# Patient Record
Sex: Male | Born: 1965
Health system: Southern US, Community
[De-identification: ages and names within clinical notes are randomized; demographics above are authoritative.]

## PROBLEM LIST (undated history)

## (undated) DIAGNOSIS — F32A Depression, unspecified: Secondary | ICD-10-CM

## (undated) DIAGNOSIS — M25511 Pain in right shoulder: Secondary | ICD-10-CM

## (undated) DIAGNOSIS — M1990 Unspecified osteoarthritis, unspecified site: Secondary | ICD-10-CM

## (undated) DIAGNOSIS — M542 Cervicalgia: Secondary | ICD-10-CM

## (undated) DIAGNOSIS — IMO0002 Reserved for concepts with insufficient information to code with codable children: Secondary | ICD-10-CM

## (undated) DIAGNOSIS — F524 Premature ejaculation: Secondary | ICD-10-CM

## (undated) DIAGNOSIS — E785 Hyperlipidemia, unspecified: Secondary | ICD-10-CM

## (undated) DIAGNOSIS — F419 Anxiety disorder, unspecified: Secondary | ICD-10-CM

## (undated) DIAGNOSIS — K219 Gastro-esophageal reflux disease without esophagitis: Secondary | ICD-10-CM

## (undated) DIAGNOSIS — G540 Brachial plexus disorders: Secondary | ICD-10-CM

## (undated) DIAGNOSIS — T7840XA Allergy, unspecified, initial encounter: Secondary | ICD-10-CM

## (undated) DIAGNOSIS — N411 Chronic prostatitis: Secondary | ICD-10-CM

## (undated) DIAGNOSIS — M5090 Cervical disc disorder, unspecified, unspecified cervical region: Secondary | ICD-10-CM

## (undated) DIAGNOSIS — J309 Allergic rhinitis, unspecified: Secondary | ICD-10-CM

## (undated) DIAGNOSIS — F329 Major depressive disorder, single episode, unspecified: Secondary | ICD-10-CM

## (undated) HISTORY — PX: COLONOSCOPY: SHX174

## (undated) HISTORY — DX: Chronic prostatitis: N41.1

## (undated) HISTORY — DX: Depression, unspecified: F32.A

## (undated) HISTORY — DX: Cervical disc disorder, unspecified, unspecified cervical region: M50.90

## (undated) HISTORY — DX: Hyperlipidemia, unspecified: E78.5

## (undated) HISTORY — DX: Gastro-esophageal reflux disease without esophagitis: K21.9

## (undated) HISTORY — DX: Unspecified osteoarthritis, unspecified site: M19.90

## (undated) HISTORY — DX: Allergic rhinitis, unspecified: J30.9

## (undated) HISTORY — DX: Reserved for concepts with insufficient information to code with codable children: IMO0002

## (undated) HISTORY — DX: Anxiety disorder, unspecified: F41.9

## (undated) HISTORY — DX: Pain in right shoulder: M25.511

## (undated) HISTORY — DX: Brachial plexus disorders: G54.0

## (undated) HISTORY — DX: Major depressive disorder, single episode, unspecified: F32.9

## (undated) HISTORY — DX: Allergy, unspecified, initial encounter: T78.40XA

## (undated) HISTORY — DX: Premature ejaculation: F52.4

## (undated) HISTORY — DX: Cervicalgia: M54.2

---

## 2005-10-01 ENCOUNTER — Ambulatory Visit: Payer: Self-pay | Admitting: Internal Medicine

## 2005-10-08 ENCOUNTER — Ambulatory Visit: Payer: Self-pay | Admitting: Internal Medicine

## 2005-10-16 ENCOUNTER — Ambulatory Visit: Payer: Self-pay

## 2007-08-22 ENCOUNTER — Ambulatory Visit: Payer: Self-pay | Admitting: Internal Medicine

## 2007-08-22 LAB — CONVERTED CEMR LAB
ALT: 39 units/L (ref 0–53)
AST: 43 units/L — ABNORMAL HIGH (ref 0–37)
Albumin: 4 g/dL (ref 3.5–5.2)
Alkaline Phosphatase: 47 units/L (ref 39–117)
BUN: 15 mg/dL (ref 6–23)
Basophils Absolute: 0 10*3/uL (ref 0.0–0.1)
Basophils Relative: 1.2 % — ABNORMAL HIGH (ref 0.0–1.0)
Bilirubin Urine: NEGATIVE
Bilirubin, Direct: 0.1 mg/dL (ref 0.0–0.3)
CO2: 31 meq/L (ref 19–32)
Calcium: 8.8 mg/dL (ref 8.4–10.5)
Chloride: 106 meq/L (ref 96–112)
Cholesterol: 168 mg/dL (ref 0–200)
Creatinine, Ser: 0.8 mg/dL (ref 0.4–1.5)
Eosinophils Absolute: 0.1 10*3/uL (ref 0.0–0.7)
Eosinophils Relative: 2.9 % (ref 0.0–5.0)
GFR calc Af Amer: 138 mL/min
GFR calc non Af Amer: 114 mL/min
Glucose, Bld: 82 mg/dL (ref 70–99)
HCT: 41.1 % (ref 39.0–52.0)
HDL: 38.9 mg/dL — ABNORMAL LOW (ref >39.0)
Hemoglobin, Urine: NEGATIVE
Hemoglobin: 14.7 g/dL (ref 13.0–17.0)
Ketones, ur: NEGATIVE mg/dL
LDL Cholesterol: 117 mg/dL — ABNORMAL HIGH (ref 0–99)
Leukocytes, UA: NEGATIVE
Lymphocytes Relative: 32.3 % (ref 12.0–46.0)
MCHC: 35.7 g/dL (ref 30.0–36.0)
MCV: 94.3 fL (ref 78.0–100.0)
Monocytes Absolute: 0.3 10*3/uL (ref 0.1–1.0)
Monocytes Relative: 9 % (ref 3.0–12.0)
Neutro Abs: 1.8 10*3/uL (ref 1.4–7.7)
Neutrophils Relative %: 54.6 % (ref 43.0–77.0)
Nitrite: NEGATIVE
PSA: 0.45 ng/mL (ref 0.10–4.00)
Platelets: 122 10*3/uL — ABNORMAL LOW (ref 150–400)
Potassium: 4.4 meq/L (ref 3.5–5.1)
RBC: 4.36 M/uL (ref 4.22–5.81)
RDW: 11.5 % (ref 11.5–14.6)
Sodium: 142 meq/L (ref 135–145)
Specific Gravity, Urine: 1.01 (ref 1.000–1.03)
TSH: 0.69 microintl units/mL (ref 0.35–5.50)
Total Bilirubin: 1.2 mg/dL (ref 0.3–1.2)
Total CHOL/HDL Ratio: 4.3
Total Protein, Urine: NEGATIVE mg/dL
Total Protein: 6.5 g/dL (ref 6.0–8.3)
Triglycerides: 60 mg/dL (ref 0–149)
Urine Glucose: NEGATIVE mg/dL
Urobilinogen, UA: 0.2 (ref 0.0–1.0)
VLDL: 12 mg/dL (ref 0–40)
WBC: 3.3 10*3/uL — ABNORMAL LOW (ref 4.5–10.5)
pH: 6 (ref 5.0–8.0)

## 2007-08-26 ENCOUNTER — Ambulatory Visit: Payer: Self-pay | Admitting: Internal Medicine

## 2007-08-26 DIAGNOSIS — E785 Hyperlipidemia, unspecified: Secondary | ICD-10-CM

## 2007-08-26 DIAGNOSIS — J309 Allergic rhinitis, unspecified: Secondary | ICD-10-CM | POA: Insufficient documentation

## 2007-08-26 HISTORY — DX: Allergic rhinitis, unspecified: J30.9

## 2007-08-26 HISTORY — DX: Hyperlipidemia, unspecified: E78.5

## 2007-10-22 ENCOUNTER — Emergency Department (HOSPITAL_COMMUNITY): Admission: EM | Admit: 2007-10-22 | Discharge: 2007-10-22 | Payer: Self-pay | Admitting: Family Medicine

## 2008-03-05 ENCOUNTER — Encounter: Payer: Self-pay | Admitting: Internal Medicine

## 2009-07-25 ENCOUNTER — Telehealth (INDEPENDENT_AMBULATORY_CARE_PROVIDER_SITE_OTHER): Payer: Self-pay | Admitting: *Deleted

## 2010-04-18 ENCOUNTER — Ambulatory Visit
Admission: RE | Admit: 2010-04-18 | Discharge: 2010-04-18 | Payer: Self-pay | Source: Home / Self Care | Attending: Internal Medicine | Admitting: Internal Medicine

## 2010-04-18 DIAGNOSIS — K219 Gastro-esophageal reflux disease without esophagitis: Secondary | ICD-10-CM

## 2010-04-18 HISTORY — DX: Gastro-esophageal reflux disease without esophagitis: K21.9

## 2010-05-13 NOTE — Assessment & Plan Note (Signed)
Summary: CPX/AWARE OF FEE/NML   Vital Signs:  Patient Profile:   45 Years Old Male Weight:      154 pounds Temp:     99.4 degrees F oral Pulse rate:   69 / minute BP sitting:   119 / 75  (left arm) Cuff size:   regular  Pt. in pain?   no  Vitals Entered By: Maris Berger (Aug 26, 2007 8:32 AM)                   Chief Complaint:  cpx.  History of Present Illness: baby at [age] yr oldwith viral illness; o/w doing well, no complaints    Updated Prior Medication List: MELOXICAM 7.5 MG  TABS (MELOXICAM) 1 by mouth qd ZYRTEC ALLERGY 10 MG  TABS (CETIRIZINE HCL) 1 by mouth once daily prn FLUTICASONE PROPIONATE 50 MCG/ACT  SUSP (FLUTICASONE PROPIONATE) 2 spray/side  Current Allergies (reviewed today): No known allergies   Past Medical History:    Reviewed history and no changes required:       Allergic rhinitis       chronic right shoulder and neck pain        DDD lumbar adnd c-spine       Hyperlipidemia  Past Surgical History:    Reviewed history and no changes required:       Denies surgical history   Family History:    Reviewed history and no changes required:       grandfather died at 83 yo; 4 uncles died < 26 yo  Social History:    Reviewed history and no changes required:       Married       3 children       work - TEFL teacher       Never Smoked       Alcohol use-yes   Risk Factors:  Tobacco use:  never Alcohol use:  yes   Review of Systems  The patient denies anorexia, fever, weight loss, weight gain, vision loss, decreased hearing, hoarseness, chest pain, syncope, dyspnea on exhertion, peripheral edema, prolonged cough, hemoptysis, abdominal pain, melena, hematochezia, severe indigestion/heartburn, hematuria, incontinence, genital sores, muscle weakness, suspicious skin lesions, transient blindness, difficulty walking, depression, unusual weight change, abnormal bleeding, enlarged lymph nodes, angioedema, breast masses, and  testicular masses.         all otherwise negative except for nasal allergic type drainage   Physical Exam  General:     Well-developed,well-nourished,in no acute distress; alert,appropriate and cooperative throughout examination Head:     Normocephalic and atraumatic without obvious abnormalities. No apparent alopecia or balding. Eyes:     No corneal or conjunctival inflammation noted. EOMI. Perrla. Funduscopic exam benign, without hemorrhages, exudates or papilledema. Vision grossly normal. Ears:     External ear exam shows no significant lesions or deformities.  Otoscopic examination reveals clear canals, tympanic membranes are intact bilaterally without bulging, retraction, inflammation or discharge. Hearing is grossly normal bilaterally. Nose:     External nasal examination shows no deformity or inflammation. Nasal mucosa are pink and moist without lesions or exudates. Mouth:     Oral mucosa and oropharynx without lesions or exudates.  Teeth in good repair. Neck:     No deformities, masses, or tenderness noted. Lungs:     Normal respiratory effort, chest expands symmetrically. Lungs are clear to auscultation, no crackles or wheezes. Heart:     Normal rate and regular rhythm. S1 and S2 normal without gallop,  murmur, click, rub or other extra sounds. Abdomen:     Bowel sounds positive,abdomen soft and non-tender without masses, organomegaly or hernias noted. Genitalia:     Testes bilaterally descended without nodularity, tenderness or masses. No scrotal masses or lesions. No penis lesions or urethral discharge. Msk:     No deformity or scoliosis noted of thoracic or lumbar spine.   Extremities:     No clubbing, cyanosis, edema, or deformity noted with normal full range of motion of all joints.   Neurologic:     No cranial nerve deficits noted. Station and gait are normal. Plantar reflexes are down-going bilaterally. DTRs are symmetrical throughout. Sensory, motor and coordinative  functions appear intact. Skin:     Intact without suspicious lesions or rashes Psych:     slightly anxious.      Impression & Recommendations:  Problem # 1:  Preventive Health Care (ICD-V70.0) Overall doing well, up to date, counseled on routine health concerns for screening and prevention, immunizations up to date or declined, labs reviewed, ecg reviewed    Orders: EKG w/ Interpretation (93000)   Problem # 2:  ALLERGIC RHINITIS (ICD-477.9)  His updated medication list for this problem includes:    Zyrtec Allergy 10 Mg Tabs (Cetirizine hcl) .Marland Kitchen... 1 by mouth once daily prn    Fluticasone Propionate 50 Mcg/act Susp (Fluticasone propionate) .Marland Kitchen... 2 spray/side take the zyrtec daily, add the flonase daily  Complete Medication List: 1)  Meloxicam 7.5 Mg Tabs (Meloxicam) .Marland Kitchen.. 1 by mouth qd 2)  Zyrtec Allergy 10 Mg Tabs (Cetirizine hcl) .Marland Kitchen.. 1 by mouth once daily prn 3)  Fluticasone Propionate 50 Mcg/act Susp (Fluticasone propionate) .... 2 spray/side   Patient Instructions: 1)  your form was filled out 2)  continue all medications that you may have been taking previously 3)  Please schedule a follow-up appointment in 1 year or as needed   Prescriptions: ZYRTEC ALLERGY 10 MG  TABS (CETIRIZINE HCL) 1 by mouth once daily prn  #30 x 11   Entered and Authorized by:   Corwin Levins MD   Signed by:   Corwin Levins MD on 08/26/2007   Method used:   Print then Give to Patient   RxID:   [id number] FLUTICASONE PROPIONATE 50 MCG/ACT  SUSP (FLUTICASONE PROPIONATE) 2 spray/side  #1 x 11   Entered and Authorized by:   Corwin Levins MD   Signed by:   Corwin Levins MD on 08/26/2007   Method used:   Print then Give to Patient   RxID:   [id number]  ]

## 2010-05-13 NOTE — Letter (Signed)
Summary: [facility]  [facility] Medical Record   Imported By: Briant Cedar 08/29/2007 11:41:08  _____________________________________________________________________  External Attachment:    Type:   Image     Comment:   External Document

## 2010-05-13 NOTE — Letter (Signed)
Summary: Neck pain; cervical MRI/mild DDD, flattening lordotic curve/[facility]  Neck pain; cervical MRI/mild DDD, flattening lordotic curve/[facility]   Imported By: Lester Atkinson 03/17/2008 09:55:21  _____________________________________________________________________  External Attachment:    Type:   Image     Comment:   External Document

## 2010-05-13 NOTE — Progress Notes (Signed)
  Phone Note Other Incoming   Request: Send information Summary of Call: [employer] request recieved on April.14.  Request was forwarded to [facility]

## 2010-05-15 NOTE — Assessment & Plan Note (Signed)
Summary: HEARTBURN/NWS   Vital Signs:  Patient profile:   45 year old male Height:      71 inches Weight:      154.13 pounds BMI:     21.57 O2 Sat:      98 % on Room air Temp:     98.2 degrees F oral Pulse rate:   62 / minute BP sitting:   110 / 72  (left arm) Cuff size:   regular  Vitals Entered By: Zella Ball Ewing CMA Duncan Dull) (April 18, 2010 4:17 PM)  O2 Flow:  Room air CC: Indigestion/RE   CC:  Indigestion/RE.  History of Present Illness: here with c/o at least 3 wks ongoing mod to now severe chest burning type discomfort that seems to start at the low mid chest, and going up to neck area, with sour brash, some improved wtih OTC prevacid but the package said to not take longer than 14 days so he quit.  Fortunately no dysphagia, n/v, abd pain, change in bowels or blood.  No fever, wt loss, night sweats, loss of appetite or other constitutional symptoms  Pt denies other CP, worsening sob, doe, wheezing, orthopnea, pnd, worsening LE edema, palps, dizziness or syncope .  Pt denies new neuro symptoms such as headache, facial or extremity weakness  Pt denies polydipsia, polyuria .    Preventive Screening-Counseling & Management      Drug Use:  no.    Problems Prior to Update: 1)  Gerd  (ICD-530.81) 2)  Hyperlipidemia  (ICD-272.4) 3)  Preventive Health Care  (ICD-V70.0) 4)  Allergic Rhinitis  (ICD-477.9)  Medications Prior to Update: 1)  Meloxicam 7.5 Mg  Tabs (Meloxicam) .Marland Kitchen.. 1 By Mouth Qd 2)  Zyrtec Allergy 10 Mg  Tabs (Cetirizine Hcl) .Marland Kitchen.. 1 By Mouth Once Daily Prn 3)  Fluticasone Propionate 50 Mcg/act  Susp (Fluticasone Propionate) .... 2 Spray/side  Current Medications (verified): 1)  Meloxicam 7.5 Mg  Tabs (Meloxicam) .Marland Kitchen.. 1 By Mouth Qd 2)  Zyrtec Allergy 10 Mg  Tabs (Cetirizine Hcl) .Marland Kitchen.. 1 By Mouth Once Daily Prn 3)  Fluticasone Propionate 50 Mcg/act  Susp (Fluticasone Propionate) .... 2 Spray/side 4)  Dexilant 60 Mg Cpdr (Dexlansoprazole) .Marland Kitchen.. 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 No apparent alopecia or balding. Eyes:  vision grossly intact, pupils equal, and pupils round.   Ears:  R ear normal and L ear normal.   Nose:  no external deformity and no nasal discharge.   Mouth:  no gingival abnormalities and pharynx pink and moist.   Neck:  supple and no masses.   Lungs:  normal respiratory effort and normal breath sounds.   Heart:  normal rate and regular rhythm.   Abdomen:  soft and normal bowel sounds.  with mild epigastric  tender, without guarding or rebound Msk:  no chest wall tender Extremities:  no edema, no erythema    Impression & Recommendations:  Problem # 1:  GERD (ICD-530.81)  His updated medication list for this problem includes:    Dexilant 60 Mg Cpdr (Dexlansoprazole) .Marland Kitchen... 1po once daily moderate to severe symptomatic, but fortunately no warning or extra-esoph symptoms;  for PPI tx for 4 wksm then try trial off,  to re-start for symptoms re-starting,  to GI if not improved or worsening; no need for ecg or cxr at this time I feel, or other evaluation  Complete Medication List: 1)  Meloxicam 7.5 Mg Tabs  (Meloxicam) .Marland Kitchen.. 1 by mouth qd 2)  Zyrtec Allergy 10 Mg Tabs (Cetirizine hcl) .Marland Kitchen.. 1 by mouth once daily prn 3)  Fluticasone Propionate 50 Mcg/act Susp (Fluticasone propionate) .... 2 spray/side 4)  Dexilant 60 Mg Cpdr (Dexlansoprazole) .Marland Kitchen.. 1po once daily  Patient Instructions: 1)  Please take all new medications as prescribed - the dexilant 60 mg - 1 per day - for 4 wks 2)  Please stop after 4 wks to see if symptoms return;  if so, you can re-start the medication 3)  If you are not improved in 2-3 wks, please call for  GI referral 4)  Please schedule a follow-up appointment in 4 months for CPX with labs Prescriptions: DEXILANT 60 MG CPDR (DEXLANSOPRAZOLE) 1po once daily  #30 x 11   Entered and Authorized by:   Corwin Levins MD   Signed by:   Corwin Levins MD on 04/18/2010   Method used:   Print then Give to Patient   RxID:   [id number]    Orders Added: 1)  Est. Patient Level III [14782]

## 2010-06-10 ENCOUNTER — Encounter (INDEPENDENT_AMBULATORY_CARE_PROVIDER_SITE_OTHER): Payer: Self-pay | Admitting: *Deleted

## 2010-06-10 ENCOUNTER — Telehealth: Payer: Self-pay | Admitting: Internal Medicine

## 2010-06-10 DIAGNOSIS — R109 Unspecified abdominal pain: Secondary | ICD-10-CM | POA: Insufficient documentation

## 2010-06-19 NOTE — Letter (Signed)
Summary: New Patient letter  [facility]  [address] [place], [place] [address]   Phone: [phone]  Fax: [phone]       06/10/2010 MRN: [id number]  [facility] [address] [place], [place]  [address]  Dear Todd Sims,  Welcome to the Gastroenterology Division at [facility].    You are scheduled to see Dr.  Claudette Head on June 20, 2010 at 10:45am on the 3rd floor at [facility], [address]  We ask that you try to arrive at our office 15 minutes prior to your appointment time to allow for check-in.  We would like you to complete the enclosed self-administered evaluation form prior to your visit and bring it with you on the day of your appointment.  We will review it with you.  Also, please bring a complete list of all your medications or, if you prefer, bring the medication bottles and we will list them.  Please bring your insurance card so that we may make a copy of it.  If your insurance requires a referral to see a specialist, please bring your referral form from your primary care physician.  Co-payments are due at the time of your visit and may be paid by cash, check or credit card.     Your office visit will consist of a consult with your physician (includes a physical exam), any laboratory testing he/she may order, scheduling of any necessary diagnostic testing (e.g. x-ray, ultrasound, CT-scan), and scheduling of a procedure (e.g. Endoscopy, Colonoscopy) if required.  Please allow enough time on your schedule to allow for any/all of these possibilities.    If you cannot keep your appointment, please call [phone] to cancel or reschedule prior to your appointment date.  This allows [place] the opportunity to schedule an appointment for another patient in need of care.  If you do not cancel or reschedule by 5 p.m. the business day prior to your appointment date, you will be charged a $50.00 late cancellation/no-show fee.    Thank you for  choosing [facility] for your medical needs.  We appreciate the opportunity to care for you.  Please visit [place] at our website  to learn more about our practice.                     Sincerely,                                                             The Gastroenterology Division

## 2010-06-19 NOTE — Progress Notes (Signed)
Summary: referral  Phone Note Call from Patient Call back at Home Phone [phone]   Caller: Patient---[phone] Call For: Dr Jonny Ruiz Summary of Call: PT requests referral to GI MD, pt advised if med did not work, he could get a referral. Initial call taken by: Verdell Face,  June 10, 2010 8:33 AM  Follow-up for Phone Call        ok for referral - I will do Follow-up by: Corwin Levins MD,  June 10, 2010 12:54 PM  Additional Follow-up for Phone Call Additional follow up Details #1::        Pt informed, will expect a call from [facility] with appt info Additional Follow-up by: Margaret Pyle, CMA,  June 10, 2010 1:44 PM  New Problems: ABDOMINAL PAIN OTHER SPECIFIED SITE (ICD-789.09)   New Problems: ABDOMINAL PAIN OTHER SPECIFIED SITE (ICD-789.09)

## 2010-06-20 ENCOUNTER — Encounter: Payer: Self-pay | Admitting: Gastroenterology

## 2010-06-20 ENCOUNTER — Encounter (INDEPENDENT_AMBULATORY_CARE_PROVIDER_SITE_OTHER): Payer: Self-pay | Admitting: *Deleted

## 2010-06-20 ENCOUNTER — Ambulatory Visit (INDEPENDENT_AMBULATORY_CARE_PROVIDER_SITE_OTHER): Payer: Managed Care, Other (non HMO) | Admitting: Gastroenterology

## 2010-06-20 DIAGNOSIS — K219 Gastro-esophageal reflux disease without esophagitis: Secondary | ICD-10-CM

## 2010-06-20 DIAGNOSIS — R079 Chest pain, unspecified: Secondary | ICD-10-CM

## 2010-06-20 DIAGNOSIS — R0789 Other chest pain: Secondary | ICD-10-CM

## 2010-06-20 NOTE — Progress Notes (Signed)
HPI: This is a  45 year old male relates burning chest and throat pain for the past 6 months. His symptoms occur intermittently and are frequently worsened at night and with stress. He began Dexilant  several weeks ago which has helped his symptoms but they have not resolved. His symptoms are not necessarily related to meals exertion, movement or any particular activity.  He denies shortness of breath , dysphagia, odynophagia, nausea, vomiting, and weight loss.  Current Medicines, Allergies, Past Medical History, Past Surgical History, Family history and Social History were all reviewed in [employer] [facility] electronic medical record  Past Medical History  Diagnosis Date  . Allergic rhinitis   . Shoulder pain, right   . Neck pain   . Degenerative disk disease     lumbar and C- spine  . Hyperlipemia   . GERD (gastroesophageal reflux disease)    History reviewed. No pertinent past surgical history.  reports that he has never smoked. He does not have any smokeless tobacco history on file. He reports that he drinks alcohol. He reports that he does not use illicit drugs. family history includes Lung cancer in his mother. No Known Allergies     Review of systems:  The patient reports allergies and sinus trouble, back pain , sleeping problems and urinary frequency.Pertinent positive and negative review of systems were noted in the above HPI section.  All other review of systems was otherwise negative.   Physical Exam: Vital signs from this visit reviewed Constitutional: generally well-appearing Psychiatric: alert and oriented x3 Eyes: extraocular movements intact Mouth: oral pharynx moist, no lesions Neck: supple no lymphadenopathy Cardiovascular: heart regular rate and rhythm Lungs: clear to auscultation bilaterally, no chest wall tenderness  Abdomen: soft, nontender, nondistended, no obvious ascites, no peritoneal signs, normal bowel sounds Rectal: deferred Extremities: no lower  extremity edema bilaterally Skin: no lesions on visible extremities   Assessment and plan:  Problem List as of 06/20/2010        GERD   Last Assessment & Plan Note   06/20/2010 Documentation Signed 06/20/2010 12:38 PM by Eliezer Bottom., MD     See plan as outlined under chest pain.    Chest pain, atypical   Last Assessment & Plan Note   06/20/2010 Documentation Signed 06/20/2010 12:37 PM by Eliezer Bottom., MD     Atypical chest pain described as a burning sensation across his upper chest and neck. Occasionally it is midline and radiates bilaterally. Not necessarily associated with meals, swallowing or exertion. One component of his symptoms is typical for GERD and has improved with Dexilant.  Another component of his symptoms is not typical and appears to worsen with stress. Schedule upper endoscopy. Risks, benefits, alternatives to upper endoscopy with possible biopsy and possible dilation were discussed with the patient and he consents to proceed. Add ranitidine 150 mg at bedtime. Standard antireflux measures.  Continue Dexilant.     HYPERLIPIDEMIA   ALLERGIC RHINITIS

## 2010-06-20 NOTE — Assessment & Plan Note (Signed)
See plan as outlined under chest pain.

## 2010-06-20 NOTE — Assessment & Plan Note (Signed)
Atypical chest pain described as a burning sensation across his upper chest and neck. Occasionally it is midline and radiates bilaterally. Not necessarily associated with meals, swallowing or exertion. One component of his symptoms is typical for GERD and has improved with Dexilant.  Another component of his symptoms is not typical and appears to worsen with stress. Schedule upper endoscopy. Risks, benefits, alternatives to upper endoscopy with possible biopsy and possible dilation were discussed with the patient and he consents to proceed. Add ranitidine 150 mg at bedtime. Standard antireflux measures.  Continue Dexilant.

## 2010-06-24 NOTE — Letter (Signed)
Summary: EGD Instructions  [facility]  [address] [place], [place] [address]   Phone: [phone]  Fax: [phone]       KODA ROUTON    Jan 12, 1966    MRN: [id number]       Procedure Day /Date:07/15/10 TUE     Arrival Time: 230 pm     Procedure Time:330 pm     Location of Procedure:                    [facility] (4th Floor)    PREPARATION FOR ENDOSCOPY   On 07/15/10 THE DAY OF THE PROCEDURE:  1.   No solid foods, milk or milk products are allowed after midnight the night before your procedure.  2.   Do not drink anything colored red or purple.  Avoid juices with pulp.  No orange juice.  3.  You may drink clear liquids until 130 pm , which is 2 hours before your procedure.                                                                                                CLEAR LIQUIDS INCLUDE: Water Jello Ice Popsicles Tea (sugar ok, no milk/cream) Powdered fruit flavored drinks Coffee (sugar ok, no milk/cream) Gatorade Juice: apple, white grape, white cranberry  Lemonade Clear bullion, consomm, broth Carbonated beverages (any kind) Strained chicken noodle soup Hard Candy   MEDICATION INSTRUCTIONS  Unless otherwise instructed, you should take regular prescription medications with a small sip of water as early as possible the morning of your procedure.             OTHER INSTRUCTIONS  You will need a responsible adult at least 44 years of age to accompany you and drive you home.   This person must remain in the waiting room during your procedure.  Wear loose fitting clothing that is easily removed.  Leave jewelry and other valuables at home.  However, you may wish to bring a book to read or an iPod/MP3 player to listen to music as you wait for your procedure to start.  Remove all body piercing jewelry and leave at home.  Total time from sign-in until discharge is approximately 2-3 hours.  You should go home directly after  your procedure and rest.  You can resume normal activities the day after your procedure.  The day of your procedure you should not:   Drive   Make legal decisions   Operate machinery   Drink alcohol   Return to work  You will receive specific instructions about eating, activities and medications before you leave.    The above instructions have been reviewed and explained to me by   _______________________    I fully understand and can verbalize these instructions _____________________________ Date _________

## 2010-06-24 NOTE — Assessment & Plan Note (Signed)
Summary: ABD PAIN....[facility] W DEBRA AET[employer]-INS COPAY AND CX FEE ADVISED MA...   History of Present Illness: This is a 45 year old male relates burning chest and throat pain for the past 6 months. His symptoms occur intermittently and are frequently worsened at night and with stress. He began Dexilant  several weeks ago which has helped his symptoms but they have not resolved. His symptoms are not necessarily related to meals exertion, movement or any particular activity. He denies shortness of breath , dysphagia, odynophagia, nausea, vomiting, and weight loss.    Current Medications (verified): 1)  Zyrtec Allergy 10 Mg  Tabs (Cetirizine Hcl) .Marland Kitchen.. 1 By Mouth Once Daily Prn 2)  Dexilant 60 Mg Cpdr (Dexlansoprazole) .Marland Kitchen.. 1po Once Daily 3)  Ibuprofen 200 Mg Tabs (Ibuprofen) .... Take As Directed As Needed 4)  Zantac 150 Mg Tabs (Ranitidine Hcl) .Marland Kitchen.. 1 By Mouth At Bedtime  Allergies (verified): No Known Drug Allergies  Past History:  Past Medical History: Reviewed history from 04/18/2010 and no changes required. Allergic rhinitis chronic right shoulder and neck pain  DDD lumbar adnd c-spine Hyperlipidemia GERD  Past Surgical History: Reviewed history from 08/26/2007 and no changes required. Denies surgical history  Family History: Reviewed history from 08/26/2007 and no changes required. grandfather died at 27 yo; 4 uncles died < 5 yo No FH of Colon Cancer: Hiatal Hernia Father and aunt Lung Cancer-mother  Social History: Reviewed history from 04/18/2010 and no changes required. 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         The pertinent positives and negatives are noted as above and in the HPI. All other ROS were reviewed and were negative.   Vital Signs:  Patient profile:   46 year old male Height:      71  inches Weight:      154 pounds BMI:     21.56 Pulse rate:   68 / minute Pulse rhythm:   regular BP sitting:   116 / 74  (left arm)  Vitals Entered By: Milford Cage NCMA (June 20, 2010 10:59 AM) Physical Exam Constitutional: generally well-appearing decreased auditory acuity  Psychiatric: alert and oriented x3, Cooperative with normal affect.  Eyes: extraocular movements intact  Mouth: oral pharynx moist, no lesions  Neck: supple no lymphadenopathy  Cardiovascular: heart regular rate and rhythm  Lungs: clear to auscultation bilaterally  Abdomen: soft, nontender, nondistended, no obvious ascites, no peritoneal signs, normal bowel sounds, no organomegaly masses or hernias  Rectal exam deferred Extremities: no lower extremity edema bilaterally  Skin: no lesions on visible extremities  Impression & Recommendations:  Problem # 1:  GERD (ICD-530.81) Atypical chest pain described as a burning sensation across his upper chest and neck. Occasionally it is midline and radiates bilaterally. Not necessarily associated with meals, swallowing or exertion. One component of his symptoms is typical for GERD and has improved with Dexilant. Another component of his symptoms is not typical and appears to worsen with stress. Schedule upper endoscopy. Risks, benefits, alternatives to upper endoscopy with possible biopsy and possible dilation were discussed with the patient and he consents to proceed. Add ranitidine 150 mg at bedtime. Standard antireflux measures. Continue Dexilant.   Other Orders: EGD (EGD)  Patient Instructions: 1)  Copy sent to : Oliver Barre MD 2)  Upper Endoscopy brochure given. 3)  You have been  given an Appointment for a Endoscopy. 4)  Instructions have been provided. 5)  Please add Zantac 150 mg Over the Counter medication at bedtime in addition to your Dexilant. 6)  Anti Reflux measures brochure has been given to you to follow. 7)  The medication list was reviewed and  reconciled.  All changed / newly prescribed medications were explained.  A complete medication list was provided to the patient / caregiver.

## 2010-07-14 ENCOUNTER — Encounter: Payer: Self-pay | Admitting: Gastroenterology

## 2010-07-15 ENCOUNTER — Ambulatory Visit (AMBULATORY_SURGERY_CENTER): Payer: Managed Care, Other (non HMO) | Admitting: Gastroenterology

## 2010-07-15 ENCOUNTER — Encounter: Payer: Self-pay | Admitting: Gastroenterology

## 2010-07-15 VITALS — BP 121/78 | HR 56 | Temp 97.2°F | Resp 18 | Ht 71.0 in | Wt 155.0 lb

## 2010-07-15 DIAGNOSIS — R079 Chest pain, unspecified: Secondary | ICD-10-CM

## 2010-07-15 DIAGNOSIS — K209 Esophagitis, unspecified without bleeding: Secondary | ICD-10-CM

## 2010-07-15 DIAGNOSIS — R933 Abnormal findings on diagnostic imaging of other parts of digestive tract: Secondary | ICD-10-CM

## 2010-07-15 DIAGNOSIS — K449 Diaphragmatic hernia without obstruction or gangrene: Secondary | ICD-10-CM

## 2010-07-15 DIAGNOSIS — K219 Gastro-esophageal reflux disease without esophagitis: Secondary | ICD-10-CM

## 2010-07-15 NOTE — Patient Instructions (Signed)
Discharge instructions reviewed  Continue Dexilant  GERD information given  Barrett's education given

## 2010-07-16 ENCOUNTER — Telehealth: Payer: Self-pay | Admitting: *Deleted

## 2010-07-16 NOTE — Telephone Encounter (Signed)
No message left, no identifier on home or work phone numbers.

## 2010-07-22 ENCOUNTER — Encounter: Payer: Self-pay | Admitting: Gastroenterology

## 2010-07-30 ENCOUNTER — Other Ambulatory Visit (INDEPENDENT_AMBULATORY_CARE_PROVIDER_SITE_OTHER): Payer: Managed Care, Other (non HMO)

## 2010-07-30 ENCOUNTER — Other Ambulatory Visit: Payer: Self-pay | Admitting: Internal Medicine

## 2010-07-30 DIAGNOSIS — Z0000 Encounter for general adult medical examination without abnormal findings: Secondary | ICD-10-CM

## 2010-07-30 DIAGNOSIS — Z1289 Encounter for screening for malignant neoplasm of other sites: Secondary | ICD-10-CM

## 2010-07-30 LAB — BASIC METABOLIC PANEL
BUN: 14 mg/dL (ref 6–23)
CO2: 30 mEq/L (ref 19–32)
Calcium: 9.2 mg/dL (ref 8.4–10.5)
Chloride: 105 mEq/L (ref 96–112)
Creatinine, Ser: 0.8 mg/dL (ref 0.4–1.5)
GFR: 113.43 mL/min (ref >60.00)
Glucose, Bld: 75 mg/dL (ref 70–99)
Potassium: 4.5 mEq/L (ref 3.5–5.1)
Sodium: 141 mEq/L (ref 135–145)

## 2010-07-30 LAB — HEPATIC FUNCTION PANEL
ALT: 43 U/L (ref 0–53)
AST: 45 U/L — ABNORMAL HIGH (ref 0–37)
Albumin: 4.1 g/dL (ref 3.5–5.2)
Alkaline Phosphatase: 58 U/L (ref 39–117)
Bilirubin, Direct: 0.1 mg/dL (ref 0.0–0.3)
Total Bilirubin: 0.9 mg/dL (ref 0.3–1.2)
Total Protein: 6.8 g/dL (ref 6.0–8.3)

## 2010-07-30 LAB — CBC WITH DIFFERENTIAL/PLATELET
Basophils Absolute: 0 10*3/uL (ref 0.0–0.1)
Basophils Relative: 0.4 % (ref 0.0–3.0)
Eosinophils Absolute: 0.1 10*3/uL (ref 0.0–0.7)
Eosinophils Relative: 2 % (ref 0.0–5.0)
HCT: 42.7 % (ref 39.0–52.0)
Hemoglobin: 15.1 g/dL (ref 13.0–17.0)
Lymphocytes Relative: 36.4 % (ref 12.0–46.0)
Lymphs Abs: 1.3 10*3/uL (ref 0.7–4.0)
MCHC: 35.4 g/dL (ref 30.0–36.0)
MCV: 94.3 fl (ref 78.0–100.0)
Monocytes Absolute: 0.3 10*3/uL (ref 0.1–1.0)
Monocytes Relative: 8.1 % (ref 3.0–12.0)
Neutro Abs: 1.9 10*3/uL (ref 1.4–7.7)
Neutrophils Relative %: 53.1 % (ref 43.0–77.0)
Platelets: 140 10*3/uL — ABNORMAL LOW (ref 150.0–400.0)
RBC: 4.53 Mil/uL (ref 4.22–5.81)
RDW: 12.6 % (ref 11.5–14.6)
WBC: 3.6 10*3/uL — ABNORMAL LOW (ref 4.5–10.5)

## 2010-07-30 LAB — URINALYSIS
Bilirubin Urine: NEGATIVE
Hgb urine dipstick: NEGATIVE
Ketones, ur: NEGATIVE
Leukocytes, UA: NEGATIVE
Nitrite: NEGATIVE
Specific Gravity, Urine: 1.01 (ref 1.000–1.030)
Total Protein, Urine: NEGATIVE
Urine Glucose: NEGATIVE
Urobilinogen, UA: 0.2 (ref 0.0–1.0)
pH: 6 (ref 5.0–8.0)

## 2010-07-30 LAB — LIPID PANEL
Cholesterol: 180 mg/dL (ref 0–200)
HDL: 51.5 mg/dL (ref >39.00)
LDL Cholesterol: 117 mg/dL — ABNORMAL HIGH (ref 0–99)
Total CHOL/HDL Ratio: 3
Triglycerides: 59 mg/dL (ref 0.0–149.0)
VLDL: 11.8 mg/dL (ref 0.0–40.0)

## 2010-07-30 LAB — TSH: TSH: 0.8 u[IU]/mL (ref 0.35–5.50)

## 2010-07-30 LAB — PSA: PSA: 0.56 ng/mL (ref 0.10–4.00)

## 2010-08-03 ENCOUNTER — Encounter: Payer: Self-pay | Admitting: Internal Medicine

## 2010-08-03 DIAGNOSIS — Z0000 Encounter for general adult medical examination without abnormal findings: Secondary | ICD-10-CM | POA: Insufficient documentation

## 2010-08-03 DIAGNOSIS — Z0001 Encounter for general adult medical examination with abnormal findings: Secondary | ICD-10-CM | POA: Insufficient documentation

## 2010-08-06 ENCOUNTER — Encounter: Payer: Self-pay | Admitting: Internal Medicine

## 2010-08-06 ENCOUNTER — Ambulatory Visit (INDEPENDENT_AMBULATORY_CARE_PROVIDER_SITE_OTHER): Payer: Managed Care, Other (non HMO) | Admitting: Internal Medicine

## 2010-08-06 VITALS — BP 110/80 | HR 71 | Temp 98.5°F | Ht 69.0 in | Wt 153.5 lb

## 2010-08-06 DIAGNOSIS — M5090 Cervical disc disorder, unspecified, unspecified cervical region: Secondary | ICD-10-CM

## 2010-08-06 DIAGNOSIS — F411 Generalized anxiety disorder: Secondary | ICD-10-CM

## 2010-08-06 DIAGNOSIS — F419 Anxiety disorder, unspecified: Secondary | ICD-10-CM

## 2010-08-06 DIAGNOSIS — K219 Gastro-esophageal reflux disease without esophagitis: Secondary | ICD-10-CM

## 2010-08-06 DIAGNOSIS — F524 Premature ejaculation: Secondary | ICD-10-CM

## 2010-08-06 DIAGNOSIS — E785 Hyperlipidemia, unspecified: Secondary | ICD-10-CM

## 2010-08-06 DIAGNOSIS — Z0000 Encounter for general adult medical examination without abnormal findings: Secondary | ICD-10-CM

## 2010-08-06 HISTORY — DX: Cervical disc disorder, unspecified, unspecified cervical region: M50.90

## 2010-08-06 HISTORY — DX: Premature ejaculation: F52.4

## 2010-08-06 HISTORY — DX: Anxiety disorder, unspecified: F41.9

## 2010-08-06 MED ORDER — ESCITALOPRAM OXALATE 10 MG PO TABS: 10.0000 mg | ORAL_TABLET | Freq: Every day | ORAL | Status: DC

## 2010-08-06 NOTE — Assessment & Plan Note (Signed)

## 2010-08-06 NOTE — Patient Instructions (Addendum)
Take all new medications as prescribed Continue all other medications as before Please continue your efforts at anti-reflux and diet Please return in 1 year for your yearly visit, or sooner if needed, with Lab testing done 3-5 days before

## 2010-08-06 NOTE — Assessment & Plan Note (Signed)
Vague mild symtpoms, to cont diet and I suggested re-start meds including the dexilant and even zantac qhs prn as well

## 2010-08-06 NOTE — Assessment & Plan Note (Signed)
Mild, to cont diet;  stable overall by hx and exam, most recent lab reviewed with pt, and pt to continue medical treatment as before  Lab Results  Component Value Date   LDLCALC 117* 07/30/2010

## 2010-08-06 NOTE — Assessment & Plan Note (Signed)
Chronic recurrent for yrs, has seen 2 neurosurgeons since 1997, with recurrent pain worse at night-  Ok for otc ibuprofen prn

## 2010-08-06 NOTE — Progress Notes (Signed)
Subjective:    Patient ID: Todd Sims, male    DOB: 1965-07-16, 45 y.o.   MRN: [id number]  HPI  Here for wellness and f/u;  Overall doing ok;  Pt denies CP, worsening SOB, DOE, wheezing, orthopnea, PND, worsening LE edema, palpitations, dizziness or syncope.  Pt denies neurological change such as new Headache, facial or extremity weakness.  Pt denies polydipsia, polyuria, or low sugar symptoms. Pt states overall good compliance with treatment and medications, good tolerability, and trying to follow lower cholesterol diet.  Pt denies worsening depressive symptoms, suicidal ideation or panic, though has ongoing anxiety and premature ejaculation. No fever, wt loss, night sweats, loss of appetite, or other constitutional symptoms.  Pt states good ability with ADL's, low fall risk, home safety reviewed and adequate, no significant changes in hearing or vision, and occasionally active with exercise.  Has ongoing very mild GI symptoms , has raised the head of the bed and stopped his meds and not seen a significant change in symptoms.   Past Medical History  Diagnosis Date  . Allergic rhinitis   . Shoulder pain, right   . Neck pain   . Degenerative disk disease     lumbar and C- spine  . Hyperlipemia   . GERD (gastroesophageal reflux disease)   . ALLERGIC RHINITIS 08/26/2007  . GERD 04/18/2010  . HYPERLIPIDEMIA 08/26/2007   No past surgical history on file.  reports that he has never smoked. He has never used smokeless tobacco. He reports that he drinks about 1.2 ounces of alcohol per week. He reports that he does not use illicit drugs. family history includes Colon polyps in his father and Lung cancer in his mother. No Known Allergies Current Outpatient Prescriptions on File Prior to Visit  Medication Sig Dispense Refill  . cetirizine (ZYRTEC) 10 MG tablet Take 10 mg by mouth daily. prn       . ibuprofen (ADVIL,MOTRIN) 200 MG tablet Take 200 mg by mouth. Take as directed       . dexlansoprazole  (DEXILANT) 60 MG capsule Take 60 mg by mouth daily.        . ranitidine (ZANTAC) 150 MG capsule Take 150 mg by mouth. 1 at HS        Review of Systems Review of Systems  Constitutional: Negative for diaphoresis, activity change, appetite change and unexpected weight change.  HENT: Negative for hearing loss, ear pain, facial swelling, mouth sores and neck stiffness.   Eyes: Negative for pain, redness and visual disturbance.  Respiratory: Negative for shortness of breath and wheezing.   Cardiovascular: Negative for chest pain and palpitations.  Gastrointestinal: Negative for diarrhea, blood in stool, abdominal distention and rectal pain.  Genitourinary: Negative for hematuria, flank pain and decreased urine volume.  Musculoskeletal: Negative for myalgias and joint swelling. Does have ongong recurrent right neck pain with some radiation to the right trap area, mild, without radicular pain, swelling, rash or UE weakness Skin: Negative for color change and wound.  Neurological: Negative for syncope and numbness.  Hematological: Negative for adenopathy.  Psychiatric/Behavioral: Negative for hallucinations, self-injury, decreased concentration and agitation.      Objective:   Physical Exam BP 110/80  Pulse 71  Temp(Src) 98.5 F (36.9 C) (Oral)  Ht 5\' 9"  (1.753 m)  Wt 153 lb 8 oz (69.627 kg)  BMI 22.67 kg/m2  SpO2 98% Physical Exam  VS noted Constitutional: Pt is oriented to person, place, and time. Appears well-developed and well-nourished.  HENT:  Head:  Normocephalic and atraumatic.  Right Ear: External ear normal.  Left Ear: External ear normal.  Nose: Nose normal.  Mouth/Throat: Oropharynx is clear and moist.  Eyes: Conjunctivae and EOM are normal. Pupils are equal, round, and reactive to light.  Neck: Normal range of motion. Neck supple. No JVD present. No tracheal deviation present.  Cardiovascular: Normal rate, regular rhythm, normal heart sounds and intact distal pulses.     Pulmonary/Chest: Effort normal and breath sounds normal.  Abdominal: Soft. Bowel sounds are normal. There is no tenderness.  Musculoskeletal: Normal range of motion. Exhibits no edema.  Lymphadenopathy:  Has no cervical adenopathy.  Neurological: Pt is alert and oriented to person, place, and time. Pt has normal reflexes. No cranial nerve deficit.  Skin: Skin is warm and dry. No rash noted.  Psychiatric:  Has  normal mood and affect. Behavior is normal. 1+ nervous       Assessment & Plan:

## 2010-08-06 NOTE — Assessment & Plan Note (Signed)
Ok for SSRI trial to effect delayed ejaculation

## 2010-09-04 ENCOUNTER — Telehealth: Payer: Self-pay

## 2010-09-04 DIAGNOSIS — F524 Premature ejaculation: Secondary | ICD-10-CM

## 2010-09-04 MED ORDER — ESCITALOPRAM OXALATE 10 MG PO TABS: 20.0000 mg | ORAL_TABLET | Freq: Every day | ORAL | Status: DC

## 2010-09-04 NOTE — Telephone Encounter (Signed)
Please ask pt to increase the lexapro to 20 mg for now, let us know if any fatigue and sleepy/groggy occurs, or if the Wanted side effect of Delayed ejacuation does not occur;   We could consider change to paxil, as this has higher chance of the side effect that he is wanting, but also leads to some wt gain sometimes , and is more difficult to stop if needed

## 2010-09-04 NOTE — Telephone Encounter (Signed)
Call to patient @ work number provided... Voice message left to give me a call back to discuss Dr. Raphael Gibney recommendations.

## 2010-09-04 NOTE — Telephone Encounter (Signed)
Pt called stating that Lexapro is not helping with SXS discussed at OV. Pt is requesting a stronger medication, please advise.

## 2010-09-05 NOTE — Telephone Encounter (Signed)
Pt advised and will call back if needed (SE)

## 2010-11-17 ENCOUNTER — Other Ambulatory Visit: Payer: Self-pay

## 2010-11-17 MED ORDER — DEXLANSOPRAZOLE 60 MG PO CPDR: 60.0000 mg | DELAYED_RELEASE_CAPSULE | Freq: Every day | ORAL | Status: DC

## 2010-11-19 ENCOUNTER — Telehealth: Payer: Self-pay

## 2010-11-19 NOTE — Telephone Encounter (Signed)
Based on patients current benefits alternatives less expensive than Dexilant would be omeprazole and pantoprazole. Please advise if change for this patient.

## 2010-11-19 NOTE — Telephone Encounter (Signed)
Ok to change to pantoprazole , only if ok with pt - to robin to handle (I think would be ok to try, and is less expensive)

## 2010-11-20 ENCOUNTER — Other Ambulatory Visit: Payer: Self-pay

## 2010-11-20 MED ORDER — PANTOPRAZOLE SODIUM 40 MG PO TBEC: 40.0000 mg | DELAYED_RELEASE_TABLET | Freq: Every day | ORAL | Status: DC

## 2010-11-20 MED ORDER — SERTRALINE HCL 100 MG PO TABS: 100.0000 mg | ORAL_TABLET | Freq: Every day | ORAL | Status: DC

## 2010-11-20 NOTE — Telephone Encounter (Signed)
Called the patient informed of change and [facility]

## 2010-11-20 NOTE — Telephone Encounter (Signed)
Called the pateint he is ok changing to pantoprazole (what dosage), also he informed Lexapro doubling up made him sleepy and actually stated taking once daily made him sleepy so he does need alternative for lexapro.

## 2010-11-20 NOTE — Telephone Encounter (Signed)
Ok to stop the [employer] zoloft 100 mg qd (generic)  Start pantoprazole 40 mg qd   To robin to handle

## 2010-11-20 NOTE — Telephone Encounter (Signed)
Called the patient left message to call back 

## 2010-12-24 ENCOUNTER — Inpatient Hospital Stay (INDEPENDENT_AMBULATORY_CARE_PROVIDER_SITE_OTHER)
Admission: RE | Admit: 2010-12-24 | Discharge: 2010-12-24 | Disposition: A | Discharge status: Home / Self Care | Payer: Managed Care, Other (non HMO) | Source: Ambulatory Visit | Attending: Emergency Medicine | Admitting: Emergency Medicine

## 2010-12-24 ENCOUNTER — Telehealth: Payer: Self-pay

## 2010-12-24 DIAGNOSIS — L509 Urticaria, unspecified: Secondary | ICD-10-CM

## 2010-12-24 NOTE — Telephone Encounter (Signed)
Benadryl 50 mg q6h prn. OV with first available MD tomorrow if no improvement - Per MD. Pt advised of same.

## 2010-12-24 NOTE — Telephone Encounter (Signed)
Pt called stating he has broken out with hives on both arm x 3 hours. They are itchy and red but pt denies any other sxs. Please advise, Benadryl okay?

## 2010-12-24 NOTE — Telephone Encounter (Signed)
Ok for benadryl 50 mg prn,  OV tomorrow is not improved, or ER if worsening rash, sob, tongue swelling

## 2011-08-04 ENCOUNTER — Other Ambulatory Visit (INDEPENDENT_AMBULATORY_CARE_PROVIDER_SITE_OTHER): Payer: Managed Care, Other (non HMO)

## 2011-08-04 DIAGNOSIS — Z0000 Encounter for general adult medical examination without abnormal findings: Secondary | ICD-10-CM

## 2011-08-04 LAB — LIPID PANEL
Cholesterol: 202 mg/dL — ABNORMAL HIGH (ref 0–200)
HDL: 48.3 mg/dL (ref >39.00)
Total CHOL/HDL Ratio: 4
Triglycerides: 87 mg/dL (ref 0.0–149.0)
VLDL: 17.4 mg/dL (ref 0.0–40.0)

## 2011-08-04 LAB — CBC WITH DIFFERENTIAL/PLATELET
Basophils Absolute: 0 10*3/uL (ref 0.0–0.1)
Basophils Relative: 0.5 % (ref 0.0–3.0)
Eosinophils Absolute: 0.1 10*3/uL (ref 0.0–0.7)
Eosinophils Relative: 1.5 % (ref 0.0–5.0)
HCT: 44.5 % (ref 39.0–52.0)
Hemoglobin: 15.5 g/dL (ref 13.0–17.0)
Lymphocytes Relative: 26.6 % (ref 12.0–46.0)
Lymphs Abs: 1.4 10*3/uL (ref 0.7–4.0)
MCHC: 34.8 g/dL (ref 30.0–36.0)
MCV: 94.2 fl (ref 78.0–100.0)
Monocytes Absolute: 0.4 10*3/uL (ref 0.1–1.0)
Monocytes Relative: 7.9 % (ref 3.0–12.0)
Neutro Abs: 3.4 10*3/uL (ref 1.4–7.7)
Neutrophils Relative %: 63.5 % (ref 43.0–77.0)
Platelets: 156 10*3/uL (ref 150.0–400.0)
RBC: 4.73 Mil/uL (ref 4.22–5.81)
RDW: 12 % (ref 11.5–14.6)
WBC: 5.4 10*3/uL (ref 4.5–10.5)

## 2011-08-04 LAB — URINALYSIS, ROUTINE W REFLEX MICROSCOPIC
Bilirubin Urine: NEGATIVE
Hgb urine dipstick: NEGATIVE
Ketones, ur: NEGATIVE
Leukocytes, UA: NEGATIVE
Nitrite: NEGATIVE
Specific Gravity, Urine: 1.015 (ref 1.000–1.030)
Total Protein, Urine: NEGATIVE
Urine Glucose: NEGATIVE
Urobilinogen, UA: 0.2 (ref 0.0–1.0)
pH: 6.5 (ref 5.0–8.0)

## 2011-08-04 LAB — HEPATIC FUNCTION PANEL
ALT: 37 U/L (ref 0–53)
AST: 37 U/L (ref 0–37)
Albumin: 4.3 g/dL (ref 3.5–5.2)
Alkaline Phosphatase: 59 U/L (ref 39–117)
Bilirubin, Direct: 0.1 mg/dL (ref 0.0–0.3)
Total Bilirubin: 0.5 mg/dL (ref 0.3–1.2)
Total Protein: 6.8 g/dL (ref 6.0–8.3)

## 2011-08-04 LAB — BASIC METABOLIC PANEL
BUN: 23 mg/dL (ref 6–23)
CO2: 29 mEq/L (ref 19–32)
Calcium: 9 mg/dL (ref 8.4–10.5)
Chloride: 104 mEq/L (ref 96–112)
Creatinine, Ser: 0.8 mg/dL (ref 0.4–1.5)
GFR: 105.19 mL/min (ref >60.00)
Glucose, Bld: 84 mg/dL (ref 70–99)
Potassium: 4.7 mEq/L (ref 3.5–5.1)
Sodium: 139 mEq/L (ref 135–145)

## 2011-08-04 LAB — PSA: PSA: 0.69 ng/mL (ref 0.10–4.00)

## 2011-08-04 LAB — LDL CHOLESTEROL, DIRECT: Direct LDL: 137.4 mg/dL

## 2011-08-04 LAB — TSH: TSH: 0.8 u[IU]/mL (ref 0.35–5.50)

## 2011-08-07 ENCOUNTER — Other Ambulatory Visit: Payer: Managed Care, Other (non HMO)

## 2011-08-07 ENCOUNTER — Ambulatory Visit (INDEPENDENT_AMBULATORY_CARE_PROVIDER_SITE_OTHER): Payer: Managed Care, Other (non HMO) | Admitting: Internal Medicine

## 2011-08-07 ENCOUNTER — Encounter: Payer: Self-pay | Admitting: Internal Medicine

## 2011-08-07 VITALS — BP 110/70 | HR 77 | Temp 98.2°F | Ht 70.0 in | Wt 163.4 lb

## 2011-08-07 DIAGNOSIS — Z0000 Encounter for general adult medical examination without abnormal findings: Secondary | ICD-10-CM

## 2011-08-07 DIAGNOSIS — N411 Chronic prostatitis: Secondary | ICD-10-CM

## 2011-08-07 DIAGNOSIS — G540 Brachial plexus disorders: Secondary | ICD-10-CM | POA: Insufficient documentation

## 2011-08-07 DIAGNOSIS — Z23 Encounter for immunization: Secondary | ICD-10-CM

## 2011-08-07 DIAGNOSIS — R6882 Decreased libido: Secondary | ICD-10-CM

## 2011-08-07 HISTORY — DX: Chronic prostatitis: N41.1

## 2011-08-07 HISTORY — DX: Brachial plexus disorders: G54.0

## 2011-08-07 NOTE — Patient Instructions (Addendum)
Continue all other medications as before Please have the pharmacy call with any refills you may need. You had the tetanus shot today Please go to LAB in the Basement for the blood and/or urine tests to be done today - the testosterone level You will be contacted by phone if any changes need to be made immediately.  Otherwise, you will receive a letter about your results with an explanation. Please call if you need the zoloft increased Please return in 1 year for your yearly visit, or sooner if needed, with Lab testing done 3-5 days before

## 2011-08-07 NOTE — Assessment & Plan Note (Signed)

## 2011-08-09 ENCOUNTER — Encounter: Payer: Self-pay | Admitting: Internal Medicine

## 2011-08-09 NOTE — Assessment & Plan Note (Signed)
Also for testosterone level ,  to f/u any worsening symptoms or concerns 

## 2011-08-09 NOTE — Progress Notes (Signed)
Subjective:    Patient ID: Todd Sims, male    DOB: March 18, 1966, 46 y.o.   MRN: [id number]  HPI  Here for wellness and f/u;  Overall doing ok;  Pt denies CP, worsening SOB, DOE, wheezing, orthopnea, PND, worsening LE edema, palpitations, dizziness or syncope.  Pt denies neurological change such as new Headache, facial or extremity weakness.  Pt denies polydipsia, polyuria, or low sugar symptoms. Pt states overall good compliance with treatment and medications, good tolerability, and trying to follow lower cholesterol diet.  Pt denies worsening depressive symptoms, suicidal ideation or panic. No fever, wt loss, night sweats, loss of appetite, or other constitutional symptoms.  Pt states good ability with ADL's, low fall risk, home safety reviewed and adequate, no significant changes in hearing or vision, and occasionally active with exercise.  Does have some recent wt gain, fatigue, and less libido , has some decreased urine flow intemittent such as well chronic prostatitis.   Past Medical History  Diagnosis Date  . Allergic rhinitis   . Shoulder pain, right   . Neck pain   . Degenerative disk disease     lumbar and C- spine  . Hyperlipemia   . GERD (gastroesophageal reflux disease)   . ALLERGIC RHINITIS 08/26/2007  . GERD 04/18/2010  . HYPERLIPIDEMIA 08/26/2007  . Cervical neck pain with evidence of disc disease 08/06/2010  . Anxiety 08/06/2010  . Premature ejaculation 08/06/2010  . Thoracic outlet syndrome 08/07/2011  . Chronic prostatitis 08/07/2011   No past surgical history on file.  reports that he has never smoked. He has never used smokeless tobacco. He reports that he drinks about 1.2 ounces of alcohol per week. He reports that he does not use illicit drugs. family history includes Colon polyps in his father and Lung cancer in his mother. No Known Allergies Current Outpatient Prescriptions on File Prior to Visit  Medication Sig Dispense Refill  . cetirizine (ZYRTEC) 10 MG tablet Take  10 mg by mouth daily. prn       . escitalopram (LEXAPRO) 10 MG tablet Take 2 tablets (20 mg total) by mouth daily.  60 tablet  11  . pantoprazole (PROTONIX) 40 MG tablet Take 1 tablet (40 mg total) by mouth daily.  90 tablet  3  . sertraline (ZOLOFT) 100 MG tablet Take 1 tablet (100 mg total) by mouth daily.  90 tablet  3  . dexlansoprazole (DEXILANT) 60 MG capsule Take 1 capsule (60 mg total) by mouth daily.  90 capsule  3  . ibuprofen (ADVIL,MOTRIN) 200 MG tablet Take 200 mg by mouth. Take as directed       . ranitidine (ZANTAC) 150 MG capsule Take 150 mg by mouth. 1 at HS        Review of Systems Review of Systems  Constitutional: Negative for diaphoresis, activity change, appetite change and unexpected weight change.  HENT: Negative for hearing loss, ear pain, facial swelling, mouth sores and neck stiffness.   Eyes: Negative for pain, redness and visual disturbance.  Respiratory: Negative for shortness of breath and wheezing.   Cardiovascular: Negative for chest pain and palpitations.  Gastrointestinal: Negative for diarrhea, blood in stool, abdominal distention and rectal pain.  Genitourinary: Negative for hematuria, flank pain and decreased urine volume.  Musculoskeletal: Negative for myalgias and joint swelling.  Skin: Negative for color change and wound.  Neurological: Negative for syncope and numbness.  Hematological: Negative for adenopathy.  Psychiatric/Behavioral: Negative for hallucinations, self-injury, decreased concentration and agitation.  Objective:   Physical Exam BP 110/70  Pulse 77  Temp(Src) 98.2 F (36.8 C) (Oral)  Ht 5\' 10"  (1.778 m)  Wt 163 lb 6 oz (74.106 kg)  BMI 23.44 kg/m2  SpO2 96% Physical Exam  VS noted Constitutional: Pt is oriented to person, place, and time. Appears well-developed and well-nourished.  HENT:  Head: Normocephalic and atraumatic.  Right Ear: External ear normal.  Left Ear: External ear normal.  Nose: Nose normal.    Mouth/Throat: Oropharynx is clear and moist.  Eyes: Conjunctivae and EOM are normal. Pupils are equal, round, and reactive to light.  Neck: Normal range of motion. Neck supple. No JVD present. No tracheal deviation present.  Cardiovascular: Normal rate, regular rhythm, normal heart sounds and intact distal pulses.   Pulmonary/Chest: Effort normal and breath sounds normal.  Abdominal: Soft. Bowel sounds are normal. There is no tenderness.  Musculoskeletal: Normal range of motion. Exhibits no edema.  Lymphadenopathy:  Has no cervical adenopathy.  Neurological: Pt is alert and oriented to person, place, and time. Pt has normal reflexes. No cranial nerve deficit.  Skin: Skin is warm and dry. No rash noted.  Psychiatric:  Has  normal mood and affect. Behavior is normal.     Assessment & Plan:

## 2011-08-10 ENCOUNTER — Encounter: Payer: Self-pay | Admitting: Internal Medicine

## 2011-08-10 LAB — TESTOSTERONE, FREE, TOTAL, SHBG
Sex Hormone Binding: 62 nmol/L (ref 13–71)
Testosterone, Free: 80.9 pg/mL (ref 47.0–244.0)
Testosterone-% Free: 1.4 % — ABNORMAL LOW (ref 1.6–2.9)
Testosterone: 581.78 ng/dL (ref 300–890)

## 2011-10-01 ENCOUNTER — Telehealth: Payer: Self-pay

## 2011-10-01 MED ORDER — SERTRALINE HCL 100 MG PO TABS: ORAL_TABLET | ORAL | Status: DC

## 2011-10-01 NOTE — Telephone Encounter (Signed)
Pt called stating that at last OV MD discussed the possibility of increasing Sertraline if needed. Pt is requesting to increase medication, please advise.

## 2011-10-01 NOTE — Telephone Encounter (Signed)
Ok to increase to 2 tabs per day - [facility]

## 2011-10-01 NOTE — Telephone Encounter (Signed)
Called the patient left a detailed message that prescription increase requested has been changed and sent to pharmacy.

## 2011-10-10 ENCOUNTER — Other Ambulatory Visit: Payer: Self-pay | Admitting: Internal Medicine

## 2011-12-04 DIAGNOSIS — R202 Paresthesia of skin: Secondary | ICD-10-CM | POA: Insufficient documentation

## 2011-12-04 DIAGNOSIS — M79603 Pain in arm, unspecified: Secondary | ICD-10-CM | POA: Insufficient documentation

## 2012-01-20 ENCOUNTER — Other Ambulatory Visit: Payer: Self-pay | Admitting: Orthopedic Surgery

## 2012-01-20 DIAGNOSIS — M5412 Radiculopathy, cervical region: Secondary | ICD-10-CM

## 2012-01-25 ENCOUNTER — Ambulatory Visit
Admission: RE | Admit: 2012-01-25 | Discharge: 2012-01-25 | Disposition: A | Discharge status: Home / Self Care | Payer: Managed Care, Other (non HMO) | Source: Ambulatory Visit | Attending: Orthopedic Surgery | Admitting: Orthopedic Surgery

## 2012-01-25 DIAGNOSIS — M5412 Radiculopathy, cervical region: Secondary | ICD-10-CM

## 2012-04-13 HISTORY — PX: THORACIC OUTLET SURGERY: SHX2502

## 2012-06-09 ENCOUNTER — Ambulatory Visit (INDEPENDENT_AMBULATORY_CARE_PROVIDER_SITE_OTHER): Payer: Managed Care, Other (non HMO) | Admitting: Internal Medicine

## 2012-06-09 ENCOUNTER — Encounter: Payer: Self-pay | Admitting: Internal Medicine

## 2012-06-09 VITALS — BP 120/78 | HR 72 | Temp 98.3°F | Ht 71.0 in | Wt 165.5 lb

## 2012-06-09 DIAGNOSIS — F419 Anxiety disorder, unspecified: Secondary | ICD-10-CM

## 2012-06-09 DIAGNOSIS — M79609 Pain in unspecified limb: Secondary | ICD-10-CM

## 2012-06-09 DIAGNOSIS — M79604 Pain in right leg: Secondary | ICD-10-CM

## 2012-06-09 DIAGNOSIS — F524 Premature ejaculation: Secondary | ICD-10-CM

## 2012-06-09 DIAGNOSIS — F411 Generalized anxiety disorder: Secondary | ICD-10-CM

## 2012-06-09 MED ORDER — CELECOXIB 200 MG PO CAPS: 200.0000 mg | ORAL_CAPSULE | Freq: Two times a day (BID) | ORAL | Status: DC

## 2012-06-09 MED ORDER — TADALAFIL 20 MG PO TABS: 20.0000 mg | ORAL_TABLET | Freq: Every day | ORAL | Status: DC | PRN

## 2012-06-09 NOTE — Patient Instructions (Addendum)
Please take all new medication as prescribed- the celebrex and cialis Please continue all other medications as before, and refills have been done if requested. Please call if the celebrex is too expensive, or you change your mind about the MRI and/or the Neurosurgury referral Thank you for enrolling in MyChart. Please follow the instructions below to securely access your online medical record. MyChart allows you to send messages to your doctor, view your test results, renew your prescriptions, schedule appointments, and more. To Log into My Chart online, please go by [employer] or [employer] to [email or web address], or download the MyChart App from the [employer] of [employer] .  Your Username is: [email or web address] Please send a practice Message on Mychart later today.

## 2012-06-09 NOTE — Progress Notes (Addendum)
Subjective:    Patient ID: Todd Sims, male    DOB: 1966/03/05, 47 y.o.   MRN: [id number]  HPI  Here with 3 mo pain to post right thigh, mild to mod, intermittent, worse to sit longer periods, may be better with exercise but soon starts hurting again with sitting, less noticeable with standing up and standing longer periods, and walking/motion makes it better.  Has known cervical disc dz and known thoracic outlet syndrome and may end up with surgury soon at [facility].  Has known lumbar DDD and disc herniation, but no signifcant prior tx such that he has had for the cervical with cortisone shots.  No RLE weakness/numbness, and no bowel or bladder change, fever, wt loss,  worsening LE numbness/weakness, gait change or falls. No trauma.  Father has hx of phlebitis and prompted him to come today, besides pain persisting.  Has taken ASA a few times, not sure if helped but maybe a little.  Still active about 3 times per wk with swimming, basketball adn treadmill.  Denies worsening depressive symptoms, suicidal ideation, or panic.  Has stopped the prior zoloft as it did not cause the hoped for improvement with prem. ejaculation Past Medical History  Diagnosis Date  . Allergic rhinitis   . Shoulder pain, right   . Neck pain   . Degenerative disk disease     lumbar and C- spine  . Hyperlipemia   . GERD (gastroesophageal reflux disease)   . ALLERGIC RHINITIS 08/26/2007  . GERD 04/18/2010  . HYPERLIPIDEMIA 08/26/2007  . Cervical neck pain with evidence of disc disease 08/06/2010  . Anxiety 08/06/2010  . Premature ejaculation 08/06/2010  . Thoracic outlet syndrome 08/07/2011  . Chronic prostatitis 08/07/2011   No past surgical history on file.  reports that he has never smoked. He has never used smokeless tobacco. He reports that he drinks about 1.2 ounces of alcohol per week. He reports that he does not use illicit drugs. family history includes Colon polyps in his father and Lung cancer in his mother. No  Known Allergies Current Outpatient Prescriptions on File Prior to Visit  Medication Sig Dispense Refill  . escitalopram (LEXAPRO) 10 MG tablet Take 2 tablets (20 mg total) by mouth daily.  60 tablet  11   No current facility-administered medications on file prior to visit.   Review of Systems  Constitutional: Negative for unexpected weight change, or unusual diaphoresis  HENT: Negative for tinnitus.   Eyes: Negative for photophobia and visual disturbance.  Respiratory: Negative for choking and stridor.   Gastrointestinal: Negative for vomiting and blood in stool.  Genitourinary: Negative for hematuria and decreased urine volume.  Musculoskeletal: Negative for acute joint swelling Skin: Negative for color change and wound.  Neurological: Negative for tremors and numbness other than noted  Psychiatric/Behavioral: Negative for decreased concentration or  hyperactivity.       Objective:   Physical Exam BP 120/78  Pulse 72  Temp(Src) 98.3 F (36.8 C) (Oral)  Ht 5\' 11"  (1.803 m)  Wt 165 lb 8 oz (75.07 kg)  BMI 23.09 kg/m2  SpO2 98% VS noted,  Constitutional: Pt appears well-developed and well-nourished.  HENT: Head: NCAT.  Right Ear: External ear normal.  Left Ear: External ear normal.  Eyes: Conjunctivae and EOM are normal. Pupils are equal, round, and reactive to light.  Neck: Normal range of motion. Neck supple.  Cardiovascular: Normal rate and regular rhythm.   Pulmonary/Chest: Effort normal and breath sounds normal.  Abd:  Soft,  NT, non-distended, + BS Spine nontender Neurological: Pt is alert. Not confused  motor/dtr/sens intact, gait intact Skin: Skin is warm. No erythema. No rash.  No phlebitis noted right foreleg or any swelling/tender as well, and not worse with right hip or knee flexion Psychiatric: Pt behavior is normal. Thought content normal. not overly nervous or depressed affect    Assessment & Plan:

## 2012-06-12 DIAGNOSIS — M79604 Pain in right leg: Secondary | ICD-10-CM | POA: Insufficient documentation

## 2012-06-12 NOTE — Assessment & Plan Note (Signed)
stable overall by history and exam, and pt to continue medical treatment as before,  to f/u any worsening symptoms or concerns 

## 2012-06-12 NOTE — Assessment & Plan Note (Addendum)
Declines other ssri trial today, gave cialis trial for ? Mild ED, but I am not sure this will be as effective as he hopes as the main prob seems to be prem ejaculation and somewhat decreased libido over not able to keep erection

## 2012-06-12 NOTE — Assessment & Plan Note (Signed)
Exam benign, I suspect neuritic pain, for nsaid prn, and I suggested neurology or neurosurg eval or even MRI but he declines at this time

## 2012-08-08 ENCOUNTER — Encounter: Payer: Managed Care, Other (non HMO) | Admitting: Internal Medicine

## 2012-09-01 ENCOUNTER — Encounter: Payer: Self-pay | Admitting: Internal Medicine

## 2012-09-01 ENCOUNTER — Ambulatory Visit (INDEPENDENT_AMBULATORY_CARE_PROVIDER_SITE_OTHER): Payer: Managed Care, Other (non HMO) | Admitting: Internal Medicine

## 2012-09-01 VITALS — BP 120/80 | HR 66 | Temp 98.0°F | Ht 71.0 in | Wt 162.1 lb

## 2012-09-01 DIAGNOSIS — L089 Local infection of the skin and subcutaneous tissue, unspecified: Secondary | ICD-10-CM

## 2012-09-01 MED ORDER — DOXYCYCLINE HYCLATE 100 MG PO TABS: 100.0000 mg | ORAL_TABLET | Freq: Two times a day (BID) | ORAL | Status: DC

## 2012-09-01 NOTE — Progress Notes (Signed)
  Subjective:    Patient ID: Todd Sims, male    DOB: 28-Nov-1965, 47 y.o.   MRN: [id number]  HPI  Here with 3-4 days onset left hip tick bite area now red, swollen, tender with a red streak along the inguinal ligament line and several new tender lumps to the area. No chills or other fever, dizziness. Past Medical History  Diagnosis Date  . Allergic rhinitis   . Shoulder pain, right   . Neck pain   . Degenerative disk disease     lumbar and C- spine  . Hyperlipemia   . GERD (gastroesophageal reflux disease)   . ALLERGIC RHINITIS 08/26/2007  . GERD 04/18/2010  . HYPERLIPIDEMIA 08/26/2007  . Cervical neck pain with evidence of disc disease 08/06/2010  . Anxiety 08/06/2010  . Premature ejaculation 08/06/2010  . Thoracic outlet syndrome 08/07/2011  . Chronic prostatitis 08/07/2011   No past surgical history on file.  reports that he has never smoked. He has never used smokeless tobacco. He reports that he drinks about 1.2 ounces of alcohol per week. He reports that he does not use illicit drugs. family history includes Colon polyps in his father and Lung cancer in his mother. No Known Allergies No current outpatient prescriptions on file prior to visit.   No current facility-administered medications on file prior to visit.   Review of Systems All otherwise neg per pt     Objective:   Physical Exam BP 120/80  Pulse 66  Temp(Src) 98 F (36.7 C) (Oral)  Ht 5\' 11"  (1.803 m)  Wt 162 lb 2 oz (73.539 kg)  BMI 22.62 kg/m2  SpO2 95% VS noted,  Constitutional: Pt appears well-developed and well-nourished.  HENT: Head: NCAT.  Right Ear: External ear normal.  Left Ear: External ear normal.  Eyes: Conjunctivae and EOM are normal. Pupils are equal, round, and reactive to light.  Neck: Normal range of motion. Neck supple.  Cardiovascular: Normal rate and regular rhythm.   Pulmonary/Chest: Effort normal and breath sounds normal.  Abd:  Soft, NT, non-distended, + BS Neurological: Pt is  alert. Not confused  Skin: left lateral hip at the end inguinal ligament with 2 cm area red/tender/swelling with 2 tender groin LN mobile < 1 cm  Psychiatric: Pt behavior is normal. Thought content normal.     Assessment & Plan:

## 2012-09-01 NOTE — Patient Instructions (Signed)
Please take all new medication as prescribed Please continue all other medications as before 

## 2012-09-01 NOTE — Assessment & Plan Note (Addendum)
Mild to mod, for antibx course,  to f/u any worsening symptoms or concerns 

## 2012-09-22 ENCOUNTER — Encounter: Payer: Managed Care, Other (non HMO) | Admitting: Internal Medicine

## 2012-09-28 DIAGNOSIS — G8929 Other chronic pain: Secondary | ICD-10-CM | POA: Insufficient documentation

## 2013-02-16 ENCOUNTER — Other Ambulatory Visit: Payer: Self-pay

## 2014-02-20 ENCOUNTER — Encounter: Payer: Self-pay | Admitting: Internal Medicine

## 2014-02-20 ENCOUNTER — Ambulatory Visit (INDEPENDENT_AMBULATORY_CARE_PROVIDER_SITE_OTHER): Payer: Managed Care, Other (non HMO) | Admitting: Internal Medicine

## 2014-02-20 ENCOUNTER — Other Ambulatory Visit (INDEPENDENT_AMBULATORY_CARE_PROVIDER_SITE_OTHER): Payer: Managed Care, Other (non HMO)

## 2014-02-20 VITALS — BP 130/80 | HR 58 | Temp 98.2°F | Ht 71.0 in | Wt 155.5 lb

## 2014-02-20 DIAGNOSIS — Z0000 Encounter for general adult medical examination without abnormal findings: Secondary | ICD-10-CM

## 2014-02-20 DIAGNOSIS — F419 Anxiety disorder, unspecified: Secondary | ICD-10-CM

## 2014-02-20 DIAGNOSIS — R937 Abnormal findings on diagnostic imaging of other parts of musculoskeletal system: Secondary | ICD-10-CM

## 2014-02-20 LAB — CBC WITH DIFFERENTIAL/PLATELET
Basophils Absolute: 0 10*3/uL (ref 0.0–0.1)
Basophils Relative: 0.4 % (ref 0.0–3.0)
Eosinophils Absolute: 0.1 10*3/uL (ref 0.0–0.7)
Eosinophils Relative: 1.6 % (ref 0.0–5.0)
HCT: 42.3 % (ref 39.0–52.0)
Hemoglobin: 14.1 g/dL (ref 13.0–17.0)
Lymphocytes Relative: 38.1 % (ref 12.0–46.0)
Lymphs Abs: 1.8 10*3/uL (ref 0.7–4.0)
MCHC: 33.4 g/dL (ref 30.0–36.0)
MCV: 95.9 fl (ref 78.0–100.0)
Monocytes Absolute: 0.4 10*3/uL (ref 0.1–1.0)
Monocytes Relative: 7.7 % (ref 3.0–12.0)
Neutro Abs: 2.5 10*3/uL (ref 1.4–7.7)
Neutrophils Relative %: 52.2 % (ref 43.0–77.0)
Platelets: 153 10*3/uL (ref 150.0–400.0)
RBC: 4.41 Mil/uL (ref 4.22–5.81)
RDW: 12.5 % (ref 11.5–15.5)
WBC: 4.8 10*3/uL (ref 4.0–10.5)

## 2014-02-20 LAB — URINALYSIS, ROUTINE W REFLEX MICROSCOPIC
Bilirubin Urine: NEGATIVE
Hgb urine dipstick: NEGATIVE
Ketones, ur: NEGATIVE
Leukocytes, UA: NEGATIVE
Nitrite: NEGATIVE
Specific Gravity, Urine: 1.02 (ref 1.000–1.030)
Total Protein, Urine: NEGATIVE
Urine Glucose: NEGATIVE
Urobilinogen, UA: 0.2 (ref 0.0–1.0)
pH: 6 (ref 5.0–8.0)

## 2014-02-20 MED ORDER — ESCITALOPRAM OXALATE 10 MG PO TABS: 10.0000 mg | ORAL_TABLET | Freq: Every day | ORAL | Status: DC

## 2014-02-20 NOTE — Assessment & Plan Note (Signed)
forlexapro 10 qd, delcines counseling, no SI

## 2014-02-20 NOTE — Progress Notes (Signed)
Subjective:    Patient ID: Todd Sims, male    DOB: 09-16-1965, 48 y.o.   MRN: [id number]  HPI     Here for wellness and f/u;  Overall doing ok;  Pt denies CP, worsening SOB, DOE, wheezing, orthopnea, PND, worsening LE edema, palpitations, dizziness or syncope.  Pt denies neurological change such as new headache, facial or extremity weakness.  Pt denies polydipsia, polyuria, or low sugar symptoms. Pt states overall good compliance with treatment and medications, good tolerability, and has been trying to follow lower cholesterol diet.  Pt denies worsening depressive symptoms, suicidal ideation or panic. No fever, night sweats, wt loss, loss of appetite, or other constitutional symptoms.  Pt states good ability with ADL's, has low fall risk, home safety reviewed and adequate, no other significant changes in hearing or vision, and only occasionally active with exercise.  Did have surgury approx 15 mo ago for right side thoracic outlet syndrome, 2 ribs removed, still having some pain. Declins flu shot.  Trying to work on lower chol diet.  Has had mild worsening depressive symptoms, but no suicidal ideation, or panic; has ongoing anxiety which for him is more significant than the depression, increased over past few yrs (but mild to mod prior), has son with ? Bipolar on meds that have not worked well, but changed providers and cognitive behav therapy has helped with different meds, now doing ok at college.  For the past yr has had nervous out of control, not doing well at work and sometimes nearly nonfunctioning.  No specific ADD type symptoms.  Lamictal did not work at all for son.  lexapro has worked well for son. Pt sees a lot sons symptoms in himself.   Past Medical History  Diagnosis Date  . Allergic rhinitis   . Shoulder pain, right   . Neck pain   . Degenerative disk disease     lumbar and C- spine  . Hyperlipemia   . GERD (gastroesophageal reflux disease)   . ALLERGIC RHINITIS 08/26/2007  .  GERD 04/18/2010  . HYPERLIPIDEMIA 08/26/2007  . Cervical neck pain with evidence of disc disease 08/06/2010  . Anxiety 08/06/2010  . Premature ejaculation 08/06/2010  . Thoracic outlet syndrome 08/07/2011  . Chronic prostatitis 08/07/2011   No past surgical history on file.  reports that he has never smoked. He has never used smokeless tobacco. He reports that he drinks about 1.2 oz of alcohol per week. He reports that he does not use illicit drugs. family history includes Colon polyps in his father; Lung cancer in his mother. No Known Allergies No current outpatient prescriptions on file prior to visit.   No current facility-administered medications on file prior to visit.   Review of Systems Constitutional: Negative for increased diaphoresis, other activity, appetite or other siginficant weight change  HENT: Negative for worsening hearing loss, ear pain, facial swelling, mouth sores and neck stiffness.   Eyes: Negative for other worsening pain, redness or visual disturbance.  Respiratory: Negative for shortness of breath and wheezing.   Cardiovascular: Negative for chest pain and palpitations.  Gastrointestinal: Negative for diarrhea, blood in stool, abdominal distention or other pain Genitourinary: Negative for hematuria, flank pain or change in urine volume.  Musculoskeletal: Negative for myalgias or other joint complaints.  Skin: Negative for color change and wound.  Neurological: Negative for syncope and numbness. other than noted Hematological: Negative for adenopathy. or other swelling Psychiatric/Behavioral: Negative for hallucinations, self-injury, decreased concentration or other worsening agitation.  Objective:   Physical Exam BP 130/80 mmHg  Pulse 58  Temp(Src) 98.2 F (36.8 C) (Oral)  Ht 5\' 11"  (1.803 m)  Wt 155 lb 8 oz (70.534 kg)  BMI 21.70 kg/m2  SpO2 97% VS noted,  Constitutional: Pt is oriented to person, place, and time. Appears well-developed and  well-nourished.  Head: Normocephalic and atraumatic.  Right Ear: External ear normal.  Left Ear: External ear normal.  Nose: Nose normal.  Mouth/Throat: Oropharynx is clear and moist.  Eyes: Conjunctivae and EOM are normal. Pupils are equal, round, and reactive to light.  Neck: Normal range of motion. Neck supple. No JVD present. No tracheal deviation present.  Cardiovascular: Normal rate, regular rhythm, normal heart sounds and intact distal pulses.   Pulmonary/Chest: Effort normal and breath sounds without rales or wheezing  Abdominal: Soft. Bowel sounds are normal. NT. No HSM  Musculoskeletal: Normal range of motion. Exhibits no edema.  Lymphadenopathy:  Has no cervical adenopathy.  Neurological: Pt is alert and oriented to person, place, and time. Pt has normal reflexes. No cranial nerve deficit. Motor grossly intact Skin: Skin is warm and dry. No rash noted.  Psychiatric:  Has anxiuous mood and affect. Behavior is normal.     Assessment & Plan:

## 2014-02-20 NOTE — Patient Instructions (Addendum)
Please take all new medication as prescribed - the lexapro  Please continue all other medications as before, and refills have been done if requested.  Please have the pharmacy call with any other refills you may need.  Please continue your efforts at being more active, low cholesterol diet, and weight control.  You are otherwise up to date with prevention measures today.  Please keep your appointments with your specialists as you may have planned  Please go to the LAB in the Basement (turn left off the elevator) for the tests to be done today  You will be contacted by phone if any changes need to be made immediately.  Otherwise, you will receive a letter about your results with an explanation, but please check with MyChart first.  Please remember to sign up for MyChart if you have not done so, as this will be important to you in the future with finding out test results, communicating by private email, and scheduling acute appointments online when needed.  Please return in 1 year for your yearly visit, or sooner if needed, with Lab testing done 3-5 days before  OK to cancel the Feb 2016 appt

## 2014-02-20 NOTE — Assessment & Plan Note (Signed)

## 2014-02-20 NOTE — Progress Notes (Signed)
Pre visit review using our clinic review tool, if applicable. No additional management support is needed unless otherwise documented below in the visit note. 

## 2014-02-21 LAB — HEPATIC FUNCTION PANEL
ALT: 48 U/L (ref 0–53)
AST: 44 U/L — ABNORMAL HIGH (ref 0–37)
Albumin: 3.7 g/dL (ref 3.5–5.2)
Alkaline Phosphatase: 49 U/L (ref 39–117)
Bilirubin, Direct: 0.2 mg/dL (ref 0.0–0.3)
Total Bilirubin: 0.9 mg/dL (ref 0.2–1.2)
Total Protein: 6.5 g/dL (ref 6.0–8.3)

## 2014-02-21 LAB — BASIC METABOLIC PANEL
BUN: 17 mg/dL (ref 6–23)
CO2: 29 mEq/L (ref 19–32)
Calcium: 9 mg/dL (ref 8.4–10.5)
Chloride: 104 mEq/L (ref 96–112)
Creatinine, Ser: 1 mg/dL (ref 0.4–1.5)
GFR: 88.09 mL/min (ref >60.00)
Glucose, Bld: 88 mg/dL (ref 70–99)
Potassium: 4.1 mEq/L (ref 3.5–5.1)
Sodium: 139 mEq/L (ref 135–145)

## 2014-02-21 LAB — LIPID PANEL
Cholesterol: 181 mg/dL (ref 0–200)
HDL: 48.4 mg/dL (ref >39.00)
LDL Cholesterol: 120 mg/dL — ABNORMAL HIGH (ref 0–99)
NonHDL: 132.6
Total CHOL/HDL Ratio: 4
Triglycerides: 61 mg/dL (ref 0.0–149.0)
VLDL: 12.2 mg/dL (ref 0.0–40.0)

## 2014-02-21 LAB — TSH: TSH: 1.13 u[IU]/mL (ref 0.35–4.50)

## 2014-02-21 LAB — PSA: PSA: 0.47 ng/mL (ref 0.10–4.00)

## 2014-03-05 ENCOUNTER — Encounter: Payer: Self-pay | Admitting: Internal Medicine

## 2014-04-19 ENCOUNTER — Other Ambulatory Visit: Payer: Self-pay

## 2014-04-19 MED ORDER — ESCITALOPRAM OXALATE 10 MG PO TABS: 10.0000 mg | ORAL_TABLET | Freq: Every day | ORAL | Status: DC

## 2014-05-25 ENCOUNTER — Encounter: Payer: Managed Care, Other (non HMO) | Admitting: Internal Medicine

## 2014-06-12 ENCOUNTER — Ambulatory Visit (INDEPENDENT_AMBULATORY_CARE_PROVIDER_SITE_OTHER)
Admission: RE | Admit: 2014-06-12 | Discharge: 2014-06-12 | Disposition: A | Discharge status: Home / Self Care | Payer: BLUE CROSS/BLUE SHIELD | Source: Ambulatory Visit | Attending: Internal Medicine | Admitting: Internal Medicine

## 2014-06-12 ENCOUNTER — Encounter: Payer: Self-pay | Admitting: Internal Medicine

## 2014-06-12 ENCOUNTER — Ambulatory Visit (INDEPENDENT_AMBULATORY_CARE_PROVIDER_SITE_OTHER): Payer: BLUE CROSS/BLUE SHIELD | Admitting: Internal Medicine

## 2014-06-12 VITALS — BP 118/86 | HR 62 | Temp 98.5°F | Resp 18 | Ht 71.0 in | Wt 157.1 lb

## 2014-06-12 DIAGNOSIS — M79641 Pain in right hand: Secondary | ICD-10-CM

## 2014-06-12 DIAGNOSIS — M5090 Cervical disc disorder, unspecified, unspecified cervical region: Secondary | ICD-10-CM

## 2014-06-12 DIAGNOSIS — E785 Hyperlipidemia, unspecified: Secondary | ICD-10-CM

## 2014-06-12 NOTE — Assessment & Plan Note (Signed)
For cont'd lower chol diet,  Lab Results  Component Value Date   LDLCALC 120* 02/20/2014    to f/u any worsening symptoms or concerns

## 2014-06-12 NOTE — Patient Instructions (Addendum)
Please continue all other medications as before, including the ibuprofen Please have the pharmacy call with any other refills you may need.  Please continue your efforts at being more active, low cholesterol diet, and weight control.  Please go to the XRAY Department in the Basement (go straight as you get off the elevator) for the x-ray testing  You will be contacted by phone if any changes need to be made immediately.  Otherwise, you will receive a letter about your results with an explanation, but please check with MyChart first.  Please remember to sign up for MyChart if you have not done so, as this will be important to you in the future with finding out test results, communicating by private email, and scheduling acute appointments online when needed.  Please keep your appointments with your specialists as you may have planned  You will be contacted regarding the referral for: Dr Tamala Julian, sport med

## 2014-06-12 NOTE — Progress Notes (Signed)
Subjective:    Patient ID: Todd Sims, male    DOB: April 22, 1965, 49 y.o.   MRN: [id number]  HPI  Here with 3 wks onset right > left hand pain, with most severe pain and some swelling about the first MCP, some at the second MCP as well.  Works with computers, no recent trauma, fever, hx of gout or significant other hand DJD.  No hx of systemic illness such as psoriasis or RA.  Has been dropping objects due to pain,  Has some numbness chronic of right 4th and 5th fingers related ot prior cspine surgury, but no weakness.  Pt denies chest pain, increased sob or doe, wheezing, orthopnea, PND, increased LE swelling, palpitations, dizziness or syncope. Has been working on lower chol diet,. Past Medical History  Diagnosis Date  . Allergic rhinitis   . Shoulder pain, right   . Neck pain   . Degenerative disk disease     lumbar and C- spine  . Hyperlipemia   . GERD (gastroesophageal reflux disease)   . ALLERGIC RHINITIS 08/26/2007  . GERD 04/18/2010  . HYPERLIPIDEMIA 08/26/2007  . Cervical neck pain with evidence of disc disease 08/06/2010  . Anxiety 08/06/2010  . Premature ejaculation 08/06/2010  . Thoracic outlet syndrome 08/07/2011  . Chronic prostatitis 08/07/2011   No past surgical history on file.  reports that he has never smoked. He has never used smokeless tobacco. He reports that he drinks about 1.2 oz of alcohol per week. He reports that he does not use illicit drugs. family history includes Colon polyps in his father; Lung cancer in his mother. No Known Allergies Current Outpatient Prescriptions on File Prior to Visit  Medication Sig Dispense Refill  . escitalopram (LEXAPRO) 10 MG tablet Take 1 tablet (10 mg total) by mouth daily. 90 tablet 3   No current facility-administered medications on file prior to visit.    Review of Systems  Constitutional: Negative for unusual diaphoresis or other sweats  HENT: Negative for ringing in ear Eyes: Negative for double vision or worsening  visual disturbance.  Respiratory: Negative for choking and stridor.   Gastrointestinal: Negative for vomiting or other signifcant bowel change Genitourinary: Negative for hematuria or decreased urine volume.  Musculoskeletal: Negative for other MSK pain or swelling Skin: Negative for color change and worsening wound.  Neurological: Negative for tremors and numbness other than noted  Psychiatric/Behavioral: Negative for decreased concentration or agitation other than above       Objective:   Physical Exam BP 118/86 mmHg  Pulse 62  Temp(Src) 98.5 F (36.9 C) (Oral)  Resp 18  Ht 5\' 11"  (1.803 m)  Wt 157 lb 1.3 oz (71.251 kg)  BMI 21.92 kg/m2  SpO2 96% VS noted,  Constitutional: Pt appears well-developed, well-nourished.  HENT: Head: NCAT.  Right Ear: External ear normal.  Left Ear: External ear normal.  Eyes: . Pupils are equal, round, and reactive to light. Conjunctivae and EOM are normal Neck: Normal range of motion. Neck supple.  Cardiovascular: Normal rate and regular rhythm.   Pulmonary/Chest: Effort normal and breath sounds without rales or wheezing.  Neurological: Pt is alert. Not confused , motor grossly intact Skin: Skin is warm. No rash Psychiatric: Pt behavior is normal. No agitation.  Right hand with mild tender/swelling over first MCP, less second MCP , no other significant tender/sweling, no erythema, ulcer and o/w neurovasc intact except for decr sens to LT to 4th and 5th finger right hand    Assessment &  Plan:

## 2014-06-12 NOTE — Progress Notes (Signed)
Pre visit review using our clinic review tool, if applicable. No additional management support is needed unless otherwise documented below in the visit note. 

## 2014-06-12 NOTE — Assessment & Plan Note (Signed)
I suspect this is relatively isolated arthritis/tendonitis right first and second MCP related to underlying DJD and overuse, for ibuprofen prn,

## 2014-06-12 NOTE — Assessment & Plan Note (Signed)
stable overall by history and exam, and pt to continue medical treatment as before,  to f/u any worsening symptoms or concerns 

## 2014-06-20 ENCOUNTER — Encounter: Payer: Self-pay | Admitting: Family Medicine

## 2014-06-20 ENCOUNTER — Ambulatory Visit (INDEPENDENT_AMBULATORY_CARE_PROVIDER_SITE_OTHER): Payer: BLUE CROSS/BLUE SHIELD | Admitting: Family Medicine

## 2014-06-20 ENCOUNTER — Other Ambulatory Visit (INDEPENDENT_AMBULATORY_CARE_PROVIDER_SITE_OTHER): Payer: BLUE CROSS/BLUE SHIELD

## 2014-06-20 VITALS — BP 122/74 | HR 61 | Ht 71.0 in | Wt 158.0 lb

## 2014-06-20 DIAGNOSIS — M6530 Trigger finger, unspecified finger: Secondary | ICD-10-CM | POA: Insufficient documentation

## 2014-06-20 DIAGNOSIS — M79641 Pain in right hand: Secondary | ICD-10-CM

## 2014-06-20 NOTE — Patient Instructions (Signed)
Good to meet you Trigger finger Try the pennsaid twice daily Wear the brace at night Turmeric 500mg  twice daily Vitamin D 2000 IU daily Glucosamine 1500mg  daily See me again in 3 weeks.

## 2014-06-20 NOTE — Progress Notes (Signed)
  [name] [address] [place], [place] [address] Phone: [phone] Subjective:    I'm seeing this patient by the request  of:  Cathlean Cower, MD   CC: Right hand pain  CLE:[id number] Todd Sims is a 49 y.o. male coming in with complaint of right hand pain. Patient was seen by primary care provider and x-rays were taken. These x-rays were reviewed by me and showed patient does have mild degenerative joint disease. Patient denies history of gout. Patient states that this is been a dull aching pain that seems to be worse at night. Patient states when he wakes up in the morning he has difficulty extending the index and middle finger of the right hand. Patient states that the pain seems to be more over the PIP joints is where he points. States it is more of a stiffness. Not stopping him from his daily activities. Patient rates the severity of pain as 5 out of 10. Patient has had a history of cervical neck disorder as well as thoracic outlet syndrome but states that this does not feel to be the same. Patient denies any numbness or tingling. Patient denies any family history of gout or rheumatoid arthritis.     Past medical history, social, surgical and family history all reviewed in electronic medical record.   Review of Systems: No headache, visual changes, nausea, vomiting, diarrhea, constipation, dizziness, abdominal pain, skin rash, fevers, chills, night sweats, weight loss, swollen lymph nodes, body aches, joint swelling, muscle aches, chest pain, shortness of breath, mood changes.   Objective Blood pressure 122/74, pulse 61, height 5\' 11"  (1.803 m), weight 158 lb (71.668 kg), SpO2 97 %.  General: No apparent distress alert and oriented x3 mood and affect normal, dressed appropriately.  HEENT: Pupils equal, extraocular movements intact  Respiratory: Patient's speak in full sentences and does not appear short of breath  Cardiovascular: No lower extremity  edema, non tender, no erythema  Skin: Warm dry intact with no signs of infection or rash on extremities or on axial skeleton.  Abdomen: Soft nontender  Neuro: Cranial nerves II through XII are intact, neurovascularly intact in all extremities with 2+ DTRs and 2+ pulses.  Lymph: No lymphadenopathy of posterior or anterior cervical chain or axillae bilaterally.  Gait normal with good balance and coordination.  MSK:  Non tender with full range of motion and good stability and symmetric strength and tone of shoulders, elbows, wrist, hip, knee and ankles bilaterally.  Hand exam shows the patient does have more of a trigger nodule noted at the 18th pulley over the right index finger as well as right middle finger. Patient has very minimal degenerative changes of the PIP joints. Patient also has some mild degenerative changes of the MP joints. Patient does have full range of motion with no triggering of the fingers at this time and is neurovascularly intact distally. Negative Phalen's and Tinel's full range of motion and strength of the wrist. Contralateral wrist unremarkable.  Limited musculoskeletal ultrasound was performed and interpreted by Hulan Saas, M  Limited ultrasound shows the patient second and third PIP joints show that patient does have a trigger nodule of the A-2 pulleys. Mild hypoechoic changes. With dynamic movement no triggering noted today. Impression: Trigger fingers    Impression and Recommendations:     This case required medical decision making of moderate complexity.

## 2014-06-20 NOTE — Assessment & Plan Note (Signed)
Patient does have more of a trigger finger of the index finger and the middle finger on the right hand. I think that this is giving him his trouble. Patient states that his father did have something very similar. Patient will do more of a bracing at night, given topical anti-inflammatories, we discussed icing protocol. Patient has any worsening symptoms we will consider injection. Patient will follow-up in 3 weeks to make sure that he is responding to conservative therapy. Differential also includes underlying arthritis but think this is low likelihood. Otherwise we would need to consider more of a neuromuscular juncture pathology just think is highly unlikely.

## 2014-06-20 NOTE — Progress Notes (Signed)
Pre visit review using our clinic review tool, if applicable. No additional management support is needed unless otherwise documented below in the visit note. 

## 2014-07-10 ENCOUNTER — Encounter: Payer: Self-pay | Admitting: Family Medicine

## 2014-07-10 ENCOUNTER — Ambulatory Visit (INDEPENDENT_AMBULATORY_CARE_PROVIDER_SITE_OTHER): Payer: BLUE CROSS/BLUE SHIELD | Admitting: Family Medicine

## 2014-07-10 VITALS — BP 114/74 | HR 59 | Ht 71.0 in | Wt 157.0 lb

## 2014-07-10 DIAGNOSIS — S62609A Fracture of unspecified phalanx of unspecified finger, initial encounter for closed fracture: Secondary | ICD-10-CM | POA: Diagnosis not present

## 2014-07-10 NOTE — Progress Notes (Signed)
  Todd Sims, Todd Sims Subjective:    CC: Right hand pain follow up  Todd Sims is a 49 y.o. male coming in with complaint of right hand pain. Patient was seen by primary care provider and x-rays were taken. These x-rays were reviewed by me and showed patient does have mild degenerative joint disease. Patient was also found to have more of a trigger finger. Patient has been wearing splints and doing the home exercises but has not noticed significant improvement. Patient states with regular activities it has improved but if patient tries to do any lifting with the hand he has more pain. Patient states that he had been working on a car in May of headache initially. Denies any numbness or tingling.     Past medical history, social, surgical and family history all reviewed in electronic medical record.   Review of Systems: No headache, visual changes, nausea, vomiting, diarrhea, constipation, dizziness, abdominal pain, skin rash, fevers, chills, night sweats, weight loss, swollen lymph nodes, body aches, joint swelling, muscle aches, chest pain, shortness of breath, mood changes.   Objective Blood pressure 114/74, pulse 59, height 5\' 11"  (1.803 m), weight 157 lb (71.215 kg), SpO2 97 %.  General: No apparent distress alert and oriented x3 mood and affect normal, dressed appropriately.  HEENT: Pupils equal, extraocular movements intact  Respiratory: Patient's speak in full sentences and does not appear short of breath  Cardiovascular: No lower extremity edema, non tender, no erythema  Skin: Warm dry intact with no signs of infection or rash on extremities or on axial skeleton.  Abdomen: Soft nontender  Neuro: Cranial nerves II through XII are intact, neurovascularly intact in all extremities with 2+ DTRs and 2+ pulses.  Lymph: No lymphadenopathy of posterior or anterior cervical chain or axillae  bilaterally.  Gait normal with good balance and coordination.  MSK:  Non tender with full range of motion and good stability and symmetric strength and tone of shoulders, elbows, wrist, hip, knee and ankles bilaterally.  Hand exam shows the patient does have more of a trigger nodule noted at the A2 pulley over the right index finger as well as right middle finger. Patient has very minimal degenerative changes of the PIP joints. Patient also has some mild degenerative changes of the MP joints. Patient does have full range of motion with no triggering of the fingers at this time and is neurovascularly intact distally. Negative Phalen's and Tinel's full range of motion and strength of the wrist. She will is tender to palpation on the radial side on the dorsal aspect of the third MP joint. Contralateral wrist unremarkable.  Limited musculoskeletal ultrasound was performed and interpreted by Todd Sims, M  Limited ultrasound shows the patient second and third PIP joints shows patient actually has callus formation noted over the proximal aspect. Patient does have bridging occurring. Mild overlying hypoechoic changes noted. Impression: Fracture metacarpal third finger on right hand with interval healing.    Impression and Recommendations:     This case required medical decision making of moderate complexity.

## 2014-07-10 NOTE — Patient Instructions (Addendum)
Good to see you Ice 20 minutes at night Son, do all manual labor including taking out the trash.  Try a popsicle stick at night but up to you.  Increase vitamin D to 4000 IU daily for 2 weeks then back to 2000 IU daily Call me in 2 weeks if not better at all or see me in 3 weeks so we can look at healing.

## 2014-07-10 NOTE — Assessment & Plan Note (Signed)
Patient does have more of a middle finger injury. I think that this is likely giving him the discomfort. Patient does have callus formation and we'll increase patient's vitamin D supplementation. We discussed possibly splinting at home and patient may try this. We discussed an icing regimen. We discussed that this can take another 4-6 weeks to completely heal. Patient come back though in 3-4 weeks for further evaluation. If continuing have pain advance imaging may be necessary but I think will be unlikely.

## 2014-07-10 NOTE — Progress Notes (Signed)
Pre visit review using our clinic review tool, if applicable. No additional management support is needed unless otherwise documented below in the visit note. 

## 2014-07-31 ENCOUNTER — Ambulatory Visit: Payer: BLUE CROSS/BLUE SHIELD | Admitting: Family Medicine

## 2014-08-20 ENCOUNTER — Ambulatory Visit (INDEPENDENT_AMBULATORY_CARE_PROVIDER_SITE_OTHER): Payer: BLUE CROSS/BLUE SHIELD | Admitting: Family Medicine

## 2014-08-20 ENCOUNTER — Encounter: Payer: Self-pay | Admitting: Family Medicine

## 2014-08-20 VITALS — BP 132/82 | HR 60 | Ht 71.0 in | Wt 157.0 lb

## 2014-08-20 DIAGNOSIS — S62609A Fracture of unspecified phalanx of unspecified finger, initial encounter for closed fracture: Secondary | ICD-10-CM

## 2014-08-20 NOTE — Progress Notes (Signed)
  [name] [address], [place] [address] Phone: [phone] Subjective:    CC: Right hand pain follow up  [id number] Todd Sims is a 49 y.o. male coming in with complaint of right hand pain. Patient was seen by primary care provider and x-rays were taken. These x-rays were reviewed by me and showed patient does have mild degenerative joint disease.  Patient was also found to have sugar finger as well as a very small MCP fracture with no significant displacement. Patient was to do splinting and increasing his range of motion slowly. Patient states he is not made any significant improvement. Patient states that he just moves incorrectly or extends that finger he has a sharp pain still at the MCP on the side of the index finger. Patient denies any swelling denies any numbness or any other new signs but really would say he has not made any improvement over the course last 2 months in total.    Past medical history, social, surgical and family history all reviewed in electronic medical record.   Review of Systems: No headache, visual changes, nausea, vomiting, diarrhea, constipation, dizziness, abdominal pain, skin rash, fevers, chills, night sweats, weight loss, swollen lymph nodes, body aches, joint swelling, muscle aches, chest pain, shortness of breath, mood changes.   Objective Blood pressure 132/82, pulse 60, height 5\' 11"  (1.803 m), weight 157 lb (71.215 kg), SpO2 96 %.  General: No apparent distress alert and oriented x3 mood and affect normal, dressed appropriately.  HEENT: Pupils equal, extraocular movements intact  Respiratory: Patient's speak in full sentences and does not appear short of breath  Cardiovascular: No lower extremity edema, non tender, no erythema  Skin: Warm dry intact with no signs of infection or rash on extremities or on axial skeleton.  Abdomen: Soft nontender  Neuro: Cranial nerves II through XII are intact,  neurovascularly intact in all extremities with 2+ DTRs and 2+ pulses.  Lymph: No lymphadenopathy of posterior or anterior cervical chain or axillae bilaterally.  Gait normal with good balance and coordination.  MSK:  Non tender with full range of motion and good stability and symmetric strength and tone of shoulders, elbows, wrist, hip, knee and ankles bilaterally.  Hand exam shows the patient does have more of a trigger nodule noted at the A2 pulley over the right index finger as well as right middle finger. Patient has very minimal degenerative changes of the PIP joints. Patient also has some mild degenerative changes of the MP joints. Patient does have full range of motion with no triggering of the fingers at this time and is neurovascularly intact distally. Negative Phalen's and Tinel's full range of motion and strength of the wrist.  tender to palpation on the radial side on the dorsal aspect of the third MP joint. Contralateral wrist unremarkable. No change from previous exam  Limited musculoskeletal ultrasound was performed and interpreted by Hulan Saas, M  Limited ultrasound shows the patient second and third PIP joints shows patient actually has callus formation noted over the proximal aspect. Patient does have bridging occurring  . Mild overlying hypoechoic changes noted. Impression: Fracture metacarpal third finger on right hand with minimal interval healing    Impression and Recommendations:     This case required medical decision making of moderate complexity.

## 2014-08-20 NOTE — Progress Notes (Signed)
Pre visit review using our clinic review tool, if applicable. No additional management support is needed unless otherwise documented below in the visit note. 

## 2014-08-20 NOTE — Patient Instructions (Addendum)
Good to see you New ugly brace for now.  Ice still 10 -20 minutes 2 times a day Watch lifting Come back in 2-3 weeks. If not better will consider injection.

## 2014-08-20 NOTE — Assessment & Plan Note (Signed)
Patient was given a more sustainable brace today. We are currently buddy taping in a 20 flexion. We discussed icing regimen as well as what activities to potentially avoid. Patient will start with vitamin D supple mentation at a higher dose. Patient does not make any significant improvement we may need to consider advanced imaging such as an MRI as well as the potential possibility of bisphosopates. Patient and will come back in 3 weeks.

## 2014-09-11 ENCOUNTER — Ambulatory Visit (INDEPENDENT_AMBULATORY_CARE_PROVIDER_SITE_OTHER): Payer: BLUE CROSS/BLUE SHIELD | Admitting: Family Medicine

## 2014-09-11 ENCOUNTER — Encounter: Payer: Self-pay | Admitting: Family Medicine

## 2014-09-11 VITALS — BP 122/84 | HR 55 | Ht 71.0 in | Wt 163.0 lb

## 2014-09-11 DIAGNOSIS — S62609A Fracture of unspecified phalanx of unspecified finger, initial encounter for closed fracture: Secondary | ICD-10-CM

## 2014-09-11 DIAGNOSIS — M6530 Trigger finger, unspecified finger: Secondary | ICD-10-CM

## 2014-09-11 NOTE — Patient Instructions (Signed)
Good to see you Try the medicine 1 hour before and then after activity Ice finger after activity Add tylenol 325 mg 3 times a day Continue the vitamin D See me again in 2-3 weeks and if still trouble will try injection in trigger point.

## 2014-09-11 NOTE — Progress Notes (Signed)
  [facility] [address], [place] [address] Phone: [phone] Subjective:    CC: Right hand pain follow up  [id number] Todd Sims is a 49 y.o. male coming in with complaint of right hand pain. Patient was seen by primary care provider and x-rays were taken. These x-rays were reviewed by me and showed patient does have mild degenerative joint disease.  Patient was also found to have  a very small MCP fracture with no significant displacement but slow healing.  Patient states it seems to be improving somewhat. Patient has been limiting some of the activity is been doing. Patient states that the trigger finger he's had previously has gotten somewhat worse. Denies any numbness or tingling. Still able to do daily activities.    Past medical history, social, surgical and family history all reviewed in electronic medical record.   Review of Systems: No headache, visual changes, nausea, vomiting, diarrhea, constipation, dizziness, abdominal pain, skin rash, fevers, chills, night sweats, weight loss, swollen lymph nodes, body aches, joint swelling, muscle aches, chest pain, shortness of breath, mood changes.   Objective There were no vitals taken for this visit.  General: No apparent distress alert and oriented x3 mood and affect normal, dressed appropriately.  HEENT: Pupils equal, extraocular movements intact  Respiratory: Patient's speak in full sentences and does not appear short of breath  Cardiovascular: No lower extremity edema, non tender, no erythema  Skin: Warm dry intact with no signs of infection or rash on extremities or on axial skeleton.  Abdomen: Soft nontender  Neuro: Cranial nerves II through XII are intact, neurovascularly intact in all extremities with 2+ DTRs and 2+ pulses.  Lymph: No lymphadenopathy of posterior or anterior cervical chain or axillae bilaterally.  Gait normal with good balance and coordination.  MSK:  Non tender  with full range of motion and good stability and symmetric strength and tone of shoulders, elbows, wrist, hip, knee and ankles bilaterally.  Hand exam shows the patient does have more of a trigger nodule noted at the A2 pulley over the right index finger as well as right middle finger. Worse than previous exam   Patient has very minimal degenerative changes of the PIP joints. Patient also has some mild degenerative changes of the MP joints as well as DIP joints. Mild improvement from previous exam with tenderness.  Limited musculoskeletal ultrasound was performed and interpreted by Hulan Saas, M  Limited ultrasound shows the patient second and third PIP joints shows patient actually has callus formation noted over the proximal aspect. Patient does have bridging occurring  . Hypoechoic changes significantly less. Trigger nodule noted within the tendon sheath of the first flexor tendon. Impression: Fracture metacarpal third finger healing    Impression and Recommendations:     This case required medical decision making of moderate complexity.

## 2014-09-11 NOTE — Assessment & Plan Note (Signed)
Healing. Patient is not decreasing his activity significantly which I think is what's causing the slow healing. Patient will continue with the vitamin D and use Tylenol for pain relief. We discussed icing regimen. Patient will come back and see me again in 2-3 weeks for further evaluation.

## 2014-09-11 NOTE — Assessment & Plan Note (Signed)
We'll continue to monitor. If patient has any worsening symptoms I would consider injection. Patient will follow-up in 2-3 weeks.

## 2014-09-11 NOTE — Progress Notes (Signed)
Pre visit review using our clinic review tool, if applicable. No additional management support is needed unless otherwise documented below in the visit note. 

## 2014-09-27 ENCOUNTER — Ambulatory Visit: Payer: BLUE CROSS/BLUE SHIELD | Admitting: Family Medicine

## 2015-03-25 ENCOUNTER — Other Ambulatory Visit: Payer: Self-pay | Admitting: Internal Medicine

## 2015-06-22 ENCOUNTER — Other Ambulatory Visit: Payer: Self-pay | Admitting: Internal Medicine

## 2015-07-16 DIAGNOSIS — F332 Major depressive disorder, recurrent severe without psychotic features: Secondary | ICD-10-CM | POA: Diagnosis not present

## 2015-07-24 DIAGNOSIS — F332 Major depressive disorder, recurrent severe without psychotic features: Secondary | ICD-10-CM | POA: Diagnosis not present

## 2015-07-30 DIAGNOSIS — F332 Major depressive disorder, recurrent severe without psychotic features: Secondary | ICD-10-CM | POA: Diagnosis not present

## 2015-07-31 DIAGNOSIS — F332 Major depressive disorder, recurrent severe without psychotic features: Secondary | ICD-10-CM | POA: Diagnosis not present

## 2015-08-13 DIAGNOSIS — F332 Major depressive disorder, recurrent severe without psychotic features: Secondary | ICD-10-CM | POA: Diagnosis not present

## 2015-08-27 DIAGNOSIS — F332 Major depressive disorder, recurrent severe without psychotic features: Secondary | ICD-10-CM | POA: Diagnosis not present

## 2015-09-04 DIAGNOSIS — F332 Major depressive disorder, recurrent severe without psychotic features: Secondary | ICD-10-CM | POA: Diagnosis not present

## 2015-09-11 DIAGNOSIS — F332 Major depressive disorder, recurrent severe without psychotic features: Secondary | ICD-10-CM | POA: Diagnosis not present

## 2015-09-22 ENCOUNTER — Other Ambulatory Visit: Payer: Self-pay | Admitting: Internal Medicine

## 2015-09-26 DIAGNOSIS — L570 Actinic keratosis: Secondary | ICD-10-CM | POA: Diagnosis not present

## 2015-10-08 ENCOUNTER — Ambulatory Visit: Payer: BLUE CROSS/BLUE SHIELD | Admitting: Internal Medicine

## 2015-10-11 ENCOUNTER — Ambulatory Visit (INDEPENDENT_AMBULATORY_CARE_PROVIDER_SITE_OTHER): Payer: BLUE CROSS/BLUE SHIELD | Admitting: Internal Medicine

## 2015-10-11 ENCOUNTER — Telehealth: Payer: Self-pay

## 2015-10-11 ENCOUNTER — Encounter: Payer: Self-pay | Admitting: Internal Medicine

## 2015-10-11 VITALS — BP 128/82 | HR 60 | Temp 98.6°F | Resp 20 | Wt 167.0 lb

## 2015-10-11 DIAGNOSIS — Z0000 Encounter for general adult medical examination without abnormal findings: Secondary | ICD-10-CM

## 2015-10-11 DIAGNOSIS — F419 Anxiety disorder, unspecified: Secondary | ICD-10-CM

## 2015-10-11 DIAGNOSIS — E785 Hyperlipidemia, unspecified: Secondary | ICD-10-CM

## 2015-10-11 MED ORDER — ESCITALOPRAM OXALATE 10 MG PO TABS: ORAL_TABLET | ORAL | Status: DC

## 2015-10-11 NOTE — Progress Notes (Signed)
Pre visit review using our clinic review tool, if applicable. No additional management support is needed unless otherwise documented below in the visit note. 

## 2015-10-11 NOTE — Progress Notes (Signed)
Subjective:    Patient ID: Todd Sims, male    DOB: 1965-11-15, 50 y.o.   MRN: [id number]  HPI  Here for wellness and f/u;  Overall doing ok;  Pt denies Chest pain, worsening SOB, DOE, wheezing, orthopnea, PND, worsening LE edema, palpitations, dizziness or syncope.  Pt denies neurological change such as new headache, facial or extremity weakness.  Pt denies polydipsia, polyuria, or low sugar symptoms. Pt states overall good compliance with treatment and medications, good tolerability, and has been trying to follow appropriate diet.  Pt denies worsening depressive symptoms, suicidal ideation or panic. No fever, night sweats, wt loss, loss of appetite, or other constitutional symptoms.  Pt states good ability with ADL's, has low fall risk, home safety reviewed and adequate, no other significant changes in hearing or vision, and only occasionally active with exercise. Past Medical History  Diagnosis Date  . Allergic rhinitis   . Shoulder pain, right   . Neck pain   . Degenerative disk disease     lumbar and C- spine  . Hyperlipemia   . GERD (gastroesophageal reflux disease)   . ALLERGIC RHINITIS 08/26/2007  . GERD 04/18/2010  . HYPERLIPIDEMIA 08/26/2007  . Cervical neck pain with evidence of disc disease 08/06/2010  . Anxiety 08/06/2010  . Premature ejaculation 08/06/2010  . Thoracic outlet syndrome 08/07/2011  . Chronic prostatitis 08/07/2011   No past surgical history on file.  reports that he has never smoked. He has never used smokeless tobacco. He reports that he drinks about 1.2 oz of alcohol per week. He reports that he does not use illicit drugs. family history includes Colon polyps in his father; Lung cancer in his mother. No Known Allergies No current outpatient prescriptions on file prior to visit.   No current facility-administered medications on file prior to visit.   Review of Systems Constitutional: Negative for increased diaphoresis, or other activity, appetite or  siginficant weight change other than noted HENT: Negative for worsening hearing loss, ear pain, facial swelling, mouth sores and neck stiffness.   Eyes: Negative for other worsening pain, redness or visual disturbance.  Respiratory: Negative for choking or stridor Cardiovascular: Negative for other chest pain and palpitations.  Gastrointestinal: Negative for worsening diarrhea, blood in stool, or abdominal distention Genitourinary: Negative for hematuria, flank pain or change in urine volume.  Musculoskeletal: Negative for myalgias or other joint complaints.  Skin: Negative for other color change and wound or drainage.  Neurological: Negative for syncope and numbness. other than noted Hematological: Negative for adenopathy. or other swelling Psychiatric/Behavioral: Negative for hallucinations, SI, self-injury, decreased concentration or other worsening agitation.      Objective:   Physical Exam BP 128/82 mmHg  Pulse 60  Temp(Src) 98.6 F (37 C) (Oral)  Resp 20  Wt 167 lb (75.751 kg)  SpO2 96% VS noted,  Constitutional: Pt is oriented to person, place, and time. Appears well-developed and well-nourished, in no significant distress Head: Normocephalic and atraumatic  Eyes: Conjunctivae and EOM are normal. Pupils are equal, round, and reactive to light Right Ear: External ear normal.  Left Ear: External ear normal Nose: Nose normal.  Mouth/Throat: Oropharynx is clear and moist  Neck: Normal range of motion. Neck supple. No JVD present. No tracheal deviation present or significant neck LA or mass Cardiovascular: Normal rate, regular rhythm, normal heart sounds and intact distal pulses.   Pulmonary/Chest: Effort normal and breath sounds without rales or wheezing  Abdominal: Soft. Bowel sounds are normal. NT. No HSM  Musculoskeletal: Normal range of motion. Exhibits no edema Lymphadenopathy: Has no cervical adenopathy.  Neurological: Pt is alert and oriented to person, place, and time.  Pt has normal reflexes. No cranial nerve deficit. Motor grossly intact Skin: Skin is warm and dry. No rash noted or new ulcers Psychiatric:  Has mild nervous mood and affect. Behavior is normal.     Assessment & Plan:

## 2015-10-11 NOTE — Telephone Encounter (Signed)
Medication sent to pharmacy  

## 2015-10-11 NOTE — Patient Instructions (Signed)

## 2015-10-11 NOTE — Assessment & Plan Note (Signed)
stable overall by history and exam, and pt to continue medical treatment as before,  to f/u any worsening symptoms or concerns 

## 2015-10-11 NOTE — Assessment & Plan Note (Signed)
stable overall by history and exam, recent data reviewed with pt, and pt to continue medical treatment as before,  to f/u any worsening symptoms or concerns, for lower chol diet Lab Results  Component Value Date   LDLCALC 120* 02/20/2014

## 2015-10-11 NOTE — Assessment & Plan Note (Signed)

## 2016-01-01 ENCOUNTER — Ambulatory Visit (INDEPENDENT_AMBULATORY_CARE_PROVIDER_SITE_OTHER): Payer: BLUE CROSS/BLUE SHIELD | Admitting: Internal Medicine

## 2016-01-01 ENCOUNTER — Encounter: Payer: Self-pay | Admitting: Internal Medicine

## 2016-01-01 ENCOUNTER — Ambulatory Visit (INDEPENDENT_AMBULATORY_CARE_PROVIDER_SITE_OTHER)
Admission: RE | Admit: 2016-01-01 | Discharge: 2016-01-01 | Disposition: A | Discharge status: Home / Self Care | Payer: BLUE CROSS/BLUE SHIELD | Source: Ambulatory Visit | Attending: Internal Medicine | Admitting: Internal Medicine

## 2016-01-01 ENCOUNTER — Other Ambulatory Visit (INDEPENDENT_AMBULATORY_CARE_PROVIDER_SITE_OTHER): Payer: BLUE CROSS/BLUE SHIELD

## 2016-01-01 DIAGNOSIS — R55 Syncope and collapse: Secondary | ICD-10-CM | POA: Insufficient documentation

## 2016-01-01 DIAGNOSIS — Z0000 Encounter for general adult medical examination without abnormal findings: Secondary | ICD-10-CM

## 2016-01-01 DIAGNOSIS — J9811 Atelectasis: Secondary | ICD-10-CM | POA: Diagnosis not present

## 2016-01-01 LAB — HEPATIC FUNCTION PANEL
ALT: 46 U/L (ref 0–53)
AST: 42 U/L — ABNORMAL HIGH (ref 0–37)
Albumin: 4.1 g/dL (ref 3.5–5.2)
Alkaline Phosphatase: 54 U/L (ref 39–117)
Bilirubin, Direct: 0.1 mg/dL (ref 0.0–0.3)
Total Bilirubin: 0.7 mg/dL (ref 0.2–1.2)
Total Protein: 6.5 g/dL (ref 6.0–8.3)

## 2016-01-01 LAB — URINALYSIS, ROUTINE W REFLEX MICROSCOPIC
Bilirubin Urine: NEGATIVE
Hgb urine dipstick: NEGATIVE
Ketones, ur: NEGATIVE
Leukocytes, UA: NEGATIVE
Nitrite: NEGATIVE
Specific Gravity, Urine: 1.02 (ref 1.000–1.030)
Total Protein, Urine: NEGATIVE
Urine Glucose: NEGATIVE
Urobilinogen, UA: 0.2 (ref 0.0–1.0)
pH: 6.5 (ref 5.0–8.0)

## 2016-01-01 LAB — CBC WITH DIFFERENTIAL/PLATELET
Basophils Absolute: 0 10*3/uL (ref 0.0–0.1)
Basophils Relative: 0.7 % (ref 0.0–3.0)
Eosinophils Absolute: 0.1 10*3/uL (ref 0.0–0.7)
Eosinophils Relative: 2.3 % (ref 0.0–5.0)
HCT: 43.5 % (ref 39.0–52.0)
Hemoglobin: 15.2 g/dL (ref 13.0–17.0)
Lymphocytes Relative: 39.5 % (ref 12.0–46.0)
Lymphs Abs: 1.7 10*3/uL (ref 0.7–4.0)
MCHC: 35 g/dL (ref 30.0–36.0)
MCV: 92.3 fl (ref 78.0–100.0)
Monocytes Absolute: 0.3 10*3/uL (ref 0.1–1.0)
Monocytes Relative: 8 % (ref 3.0–12.0)
Neutro Abs: 2.1 10*3/uL (ref 1.4–7.7)
Neutrophils Relative %: 49.5 % (ref 43.0–77.0)
Platelets: 163 10*3/uL (ref 150.0–400.0)
RBC: 4.72 Mil/uL (ref 4.22–5.81)
RDW: 12.6 % (ref 11.5–15.5)
WBC: 4.2 10*3/uL (ref 4.0–10.5)

## 2016-01-01 LAB — BASIC METABOLIC PANEL
BUN: 18 mg/dL (ref 6–23)
CO2: 29 mEq/L (ref 19–32)
Calcium: 8.8 mg/dL (ref 8.4–10.5)
Chloride: 106 mEq/L (ref 96–112)
Creatinine, Ser: 0.96 mg/dL (ref 0.40–1.50)
GFR: 88.46 mL/min (ref >60.00)
Glucose, Bld: 75 mg/dL (ref 70–99)
Potassium: 4.3 mEq/L (ref 3.5–5.1)
Sodium: 142 mEq/L (ref 135–145)

## 2016-01-01 LAB — LIPID PANEL
Cholesterol: 211 mg/dL — ABNORMAL HIGH (ref 0–200)
HDL: 46 mg/dL (ref >39.00)
LDL Cholesterol: 150 mg/dL — ABNORMAL HIGH (ref 0–99)
NonHDL: 165.13
Total CHOL/HDL Ratio: 5
Triglycerides: 75 mg/dL (ref 0.0–149.0)
VLDL: 15 mg/dL (ref 0.0–40.0)

## 2016-01-01 LAB — TSH: TSH: 1.21 u[IU]/mL (ref 0.35–4.50)

## 2016-01-01 LAB — PSA: PSA: 0.59 ng/mL (ref 0.10–4.00)

## 2016-01-01 NOTE — Progress Notes (Signed)
Pre visit review using our clinic review tool, if applicable. No additional management support is needed unless otherwise documented below in the visit note. 

## 2016-01-01 NOTE — Progress Notes (Signed)
Subjective:    Patient ID: Todd Sims, male    DOB: 1965/05/22, 50 y.o.   MRN: [id number]  HPI  Here to f/u, c/o 6 episodes over 3 wks of episodes of seconds of dizziness that he has tried to dismiss, until yesterday was more severe, and thinking he actually LOC for a few seconds though did not fall or other injury, wife urged him in today.  All occurred while walking, and Pt denies chest pain, increased sob or doe, wheezing, orthopnea, PND, increased LE swelling, palpitations.  Denies worsening reflux, abd pain, dysphagia, n/v, bowel change or blood.  Pt denies new neurological symptoms such as new headache, or facial or extremity weakness or numbness No overt siezure activity.  No recent dietary changes or reduced fluid intake.  Incidentally has his cpx labs done this am.  Past Medical History:  Diagnosis Date  . Allergic rhinitis   . ALLERGIC RHINITIS 08/26/2007  . Anxiety 08/06/2010  . Cervical neck pain with evidence of disc disease 08/06/2010  . Chronic prostatitis 08/07/2011  . Degenerative disk disease    lumbar and C- spine  . GERD 04/18/2010  . GERD (gastroesophageal reflux disease)   . Hyperlipemia   . HYPERLIPIDEMIA 08/26/2007  . Neck pain   . Premature ejaculation 08/06/2010  . Shoulder pain, right   . Thoracic outlet syndrome 08/07/2011   No past surgical history on file.  reports that he has never smoked. He has never used smokeless tobacco. He reports that he drinks about 1.2 oz of alcohol per week . He reports that he does not use drugs. family history includes Colon polyps in his father; Lung cancer in his mother. No Known Allergies Current Outpatient Prescriptions on File Prior to Visit  Medication Sig Dispense Refill  . escitalopram (LEXAPRO) 10 MG tablet TAKE 1 TABLET DAILY 90 tablet 3   No current facility-administered medications on file prior to visit.     Review of Systems  Constitutional: Negative for unusual diaphoresis or night sweats HENT: Negative for  ear swelling or discharge Eyes: Negative for worsening visual haziness  Respiratory: Negative for choking and stridor.   Gastrointestinal: Negative for distension or worsening eructation Genitourinary: Negative for retention or change in urine volume.  Musculoskeletal: Negative for other MSK pain or swelling Skin: Negative for color change and worsening wound Neurological: Negative for tremors and numbness other than noted  Psychiatric/Behavioral: Negative for decreased concentration or agitation other than above       Objective:   Physical Exam BP 118/72   Pulse (!) 53   Temp 98.5 F (36.9 C) (Oral)   Resp 20   Wt 170 lb (77.1 kg)   SpO2 93%   BMI 23.71 kg/m  VS noted,  Constitutional: Pt is oriented to person, place, and time. Appears well-developed and well-nourished, in no significant distress Head: Normocephalic and atraumatic  Eyes: Conjunctivae and EOM are normal. Pupils are equal, round, and reactive to light Right Ear: External ear normal.  Left Ear: External ear normal Nose: Nose normal.  Mouth/Throat: Oropharynx is clear and moist  Neck: Normal range of motion. Neck supple. No JVD present. No tracheal deviation present or significant neck LA or mass Cardiovascular: Normal rate, regular rhythm, normal heart sounds and intact distal pulses.   Pulmonary/Chest: Effort normal and breath sounds without rales or wheezing  Abdominal: Soft. Bowel sounds are normal. NT. No HSM  Musculoskeletal: Normal range of motion. Exhibits no edema Lymphadenopathy: Has no cervical adenopathy.  Neurological:  Pt is alert and oriented to person, place, and time. Pt has normal reflexes. No cranial nerve deficit. Motor 5/5 intact, cn 2-12 intact, ftn intact Skin: Skin is warm and dry. No rash noted or new ulcers Psychiatric:  Has normal mood and affect. Behavior is normal.   ECG today I have personally interpreted Sinus  Bradycardia  Low voltage in precordial leads.  ABNORMAL   Lab  Results  Component Value Date   WBC 4.2 01/01/2016   HGB 15.2 01/01/2016   HCT 43.5 01/01/2016   PLT 163.0 01/01/2016   GLUCOSE 75 01/01/2016   CHOL 211 (H) 01/01/2016   TRIG 75.0 01/01/2016   HDL 46.00 01/01/2016   LDLDIRECT 137.4 08/04/2011   LDLCALC 150 (H) 01/01/2016   ALT 46 01/01/2016   AST 42 (H) 01/01/2016   NA 142 01/01/2016   K 4.3 01/01/2016   CL 106 01/01/2016   CREATININE 0.96 01/01/2016   BUN 18 01/01/2016   CO2 29 01/01/2016   TSH 1.21 01/01/2016   PSA 0.59 01/01/2016       Assessment & Plan:

## 2016-01-01 NOTE — Patient Instructions (Signed)
Your EKG was [facility] today  Your lab was ok except you should follow a lower cholesterol diet  Please continue all other medications as before, and refills have been done if requested.  Please have the pharmacy call with any other refills you may need.  Please keep your appointments with your specialists as you may have planned  Please go to the XRAY Department in the Basement (go straight as you get off the elevator) for the x-ray testing  Please go to the LAB in the Basement (turn left off the elevator) for the tests to be done today  You will be contacted regarding the referral for: cardiac event monitor, and echocardiogram  We can also consider Carotid doppler and MRI head if the above is not helpful.  We can also consider cardiology or neurology referrals as well  You will be contacted by phone if any changes need to be made immediately.  Otherwise, you will receive a letter about your results with an explanation, but please check with MyChart first.  Please remember to sign up for MyChart if you have not done so, as this will be important to you in the future with finding out test results, communicating by private email, and scheduling acute appointments online when needed.

## 2016-01-04 NOTE — Assessment & Plan Note (Addendum)
Etiology unclear, ecg reviewed, labs this am reviewed with pt - benign, for echo and cardiac event monitor, but declines other such as carotids, MRI head, cardio/neurology referrals,  to f/u any worsening symptoms or concerns  Note:  Total time for pt hx, exam, review of record with pt in the room, determination of diagnoses and plan for further eval and tx is > 40 min, with over 50% spent in coordination and counseling of patient

## 2016-01-16 ENCOUNTER — Other Ambulatory Visit (HOSPITAL_COMMUNITY): Payer: BLUE CROSS/BLUE SHIELD

## 2016-01-29 ENCOUNTER — Telehealth: Payer: Self-pay | Admitting: *Deleted

## 2016-01-29 NOTE — Telephone Encounter (Signed)
Cancel Rsn: Patient (pt did not want the test at this time-sw)

## 2016-02-07 DIAGNOSIS — F332 Major depressive disorder, recurrent severe without psychotic features: Secondary | ICD-10-CM | POA: Diagnosis not present

## 2016-03-13 DIAGNOSIS — F332 Major depressive disorder, recurrent severe without psychotic features: Secondary | ICD-10-CM | POA: Diagnosis not present

## 2016-04-19 IMAGING — CR DG HAND COMPLETE 3+V*R*
3 series · 3 of 3 positions shown · non-contrast
Comparison: None.

CLINICAL DATA: Right hand pain, no trauma.  Initial evaluation.

EXAM:
RIGHT HAND - COMPLETE 3+ VIEW

[view not recorded (1 of 3)]
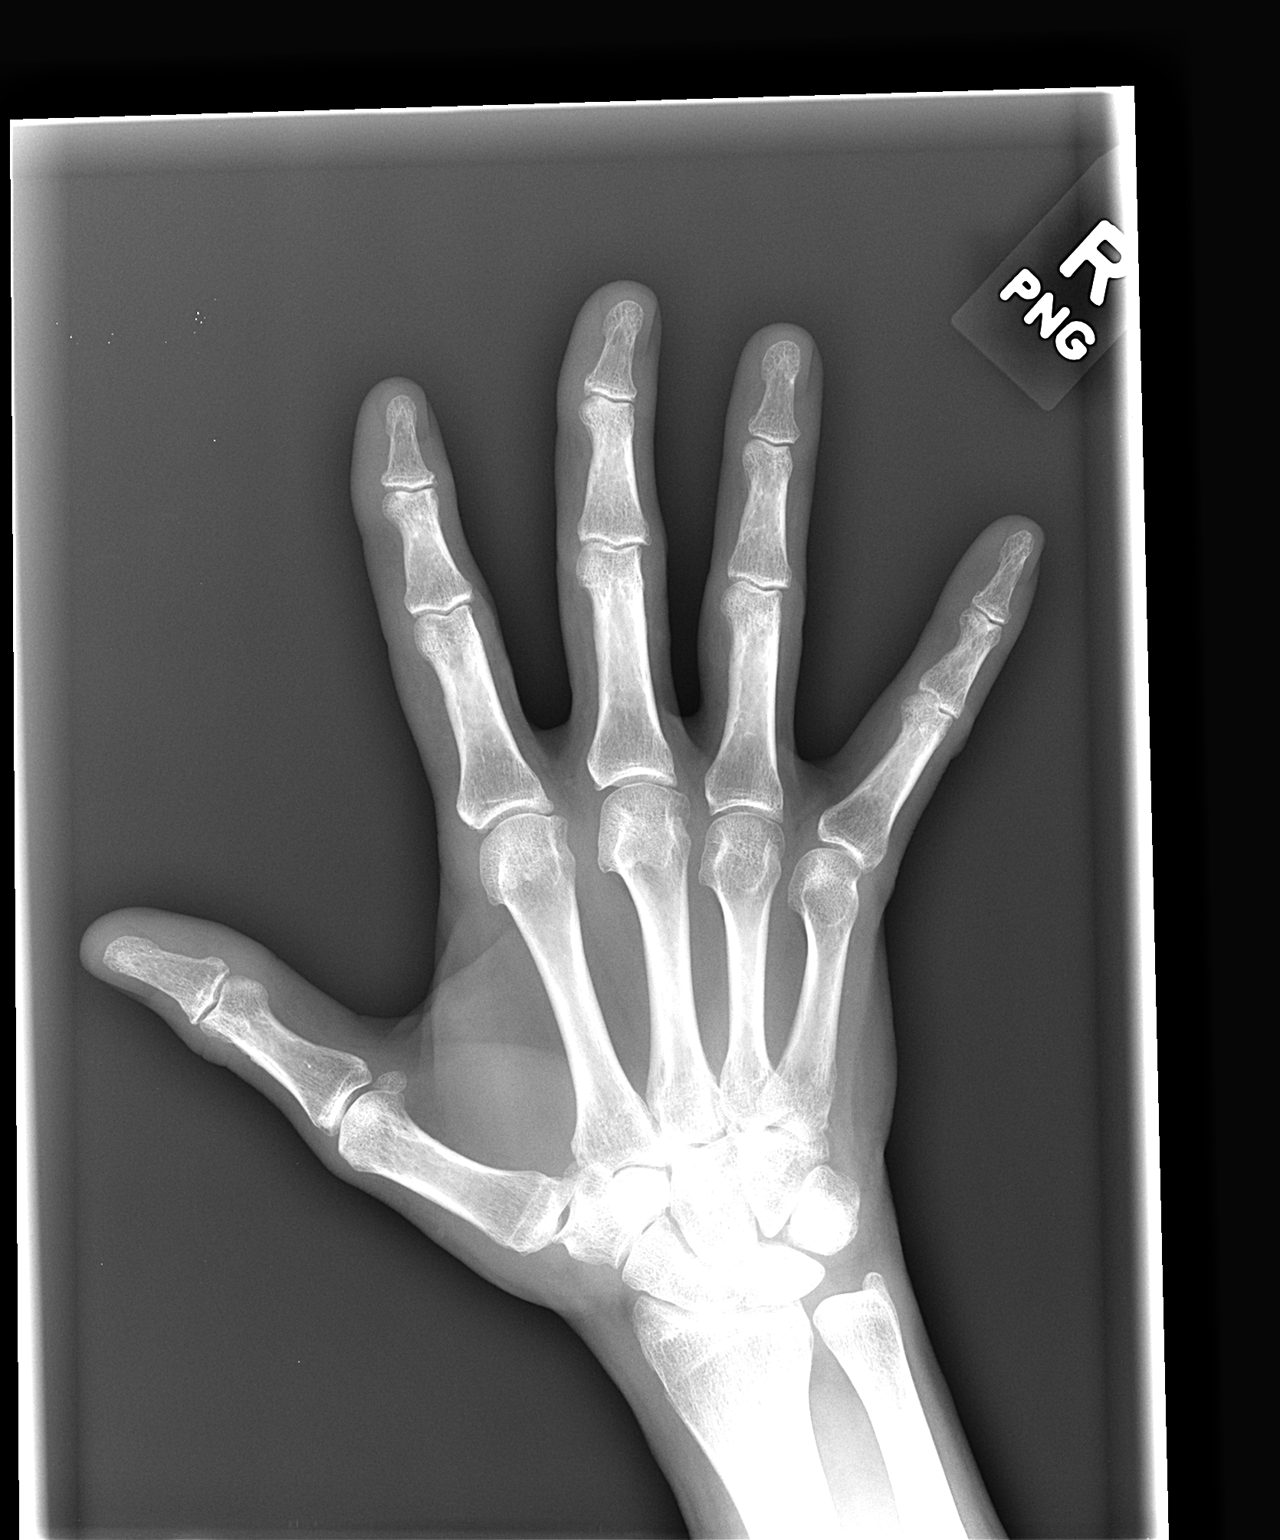

[view not recorded (2 of 3)]
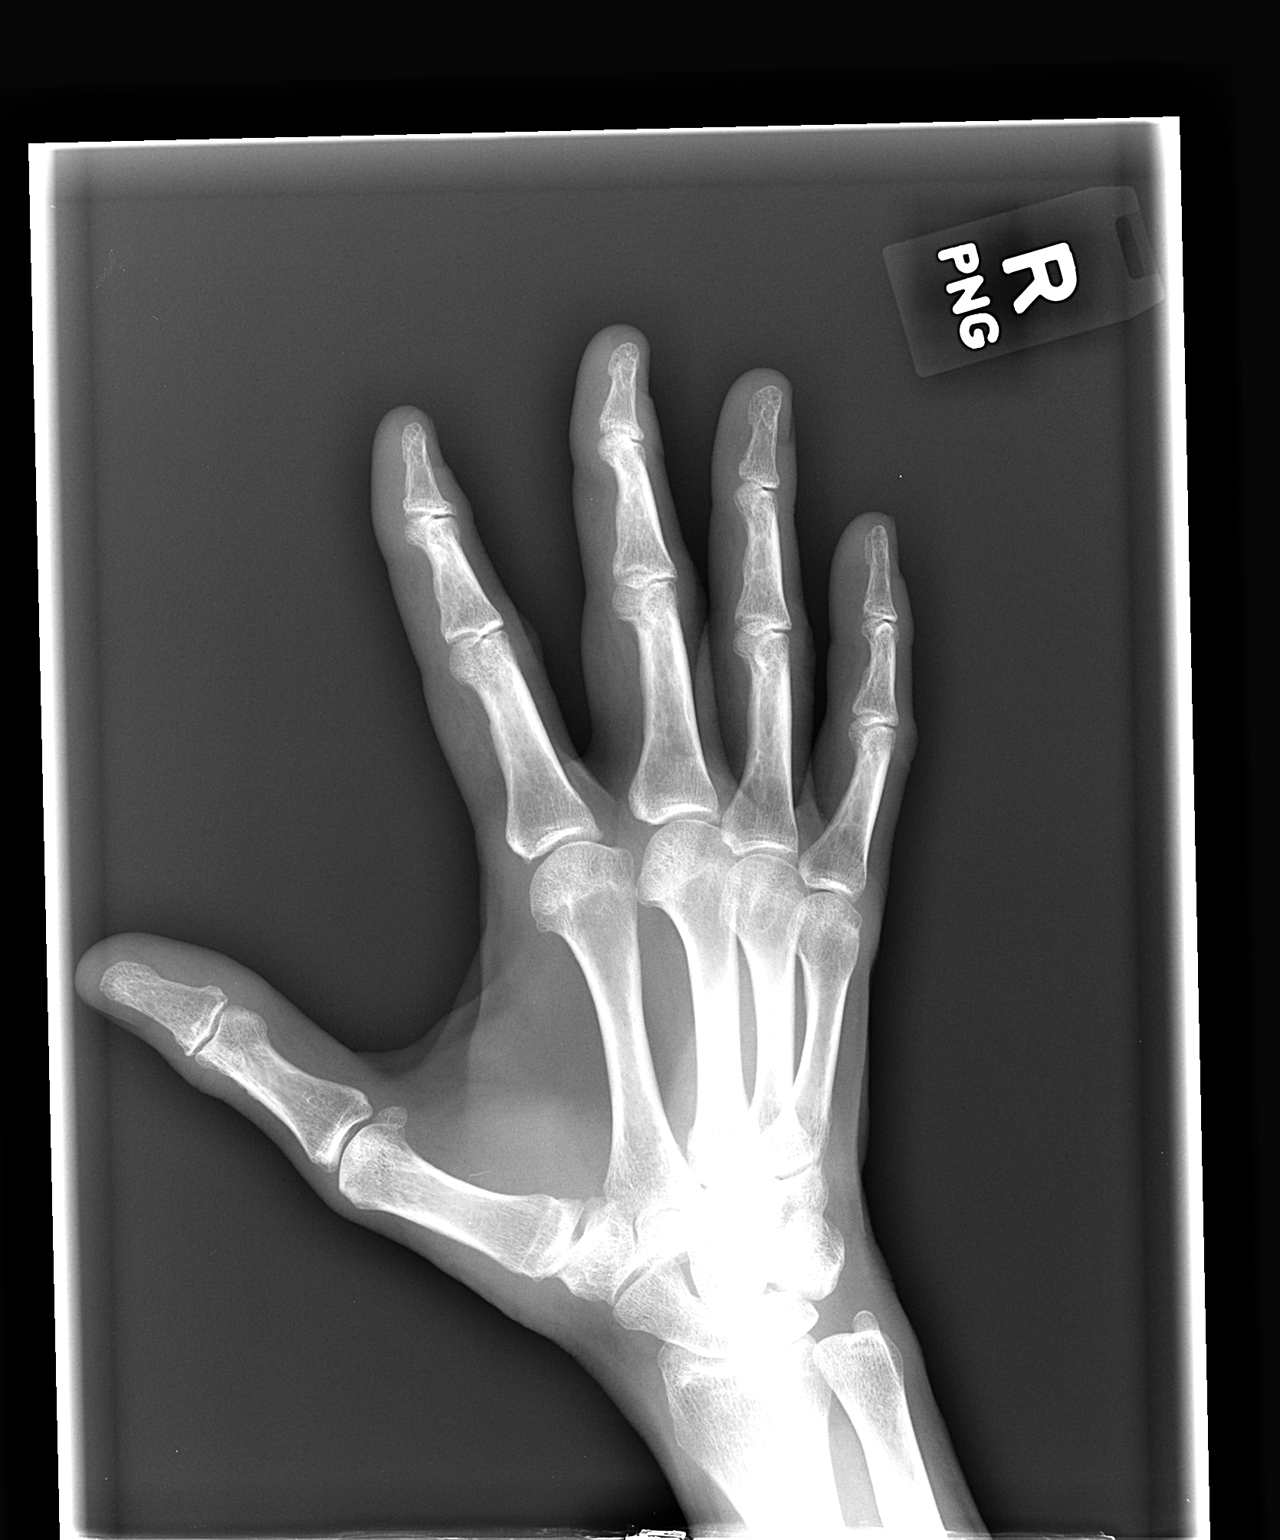

[view not recorded (3 of 3)]
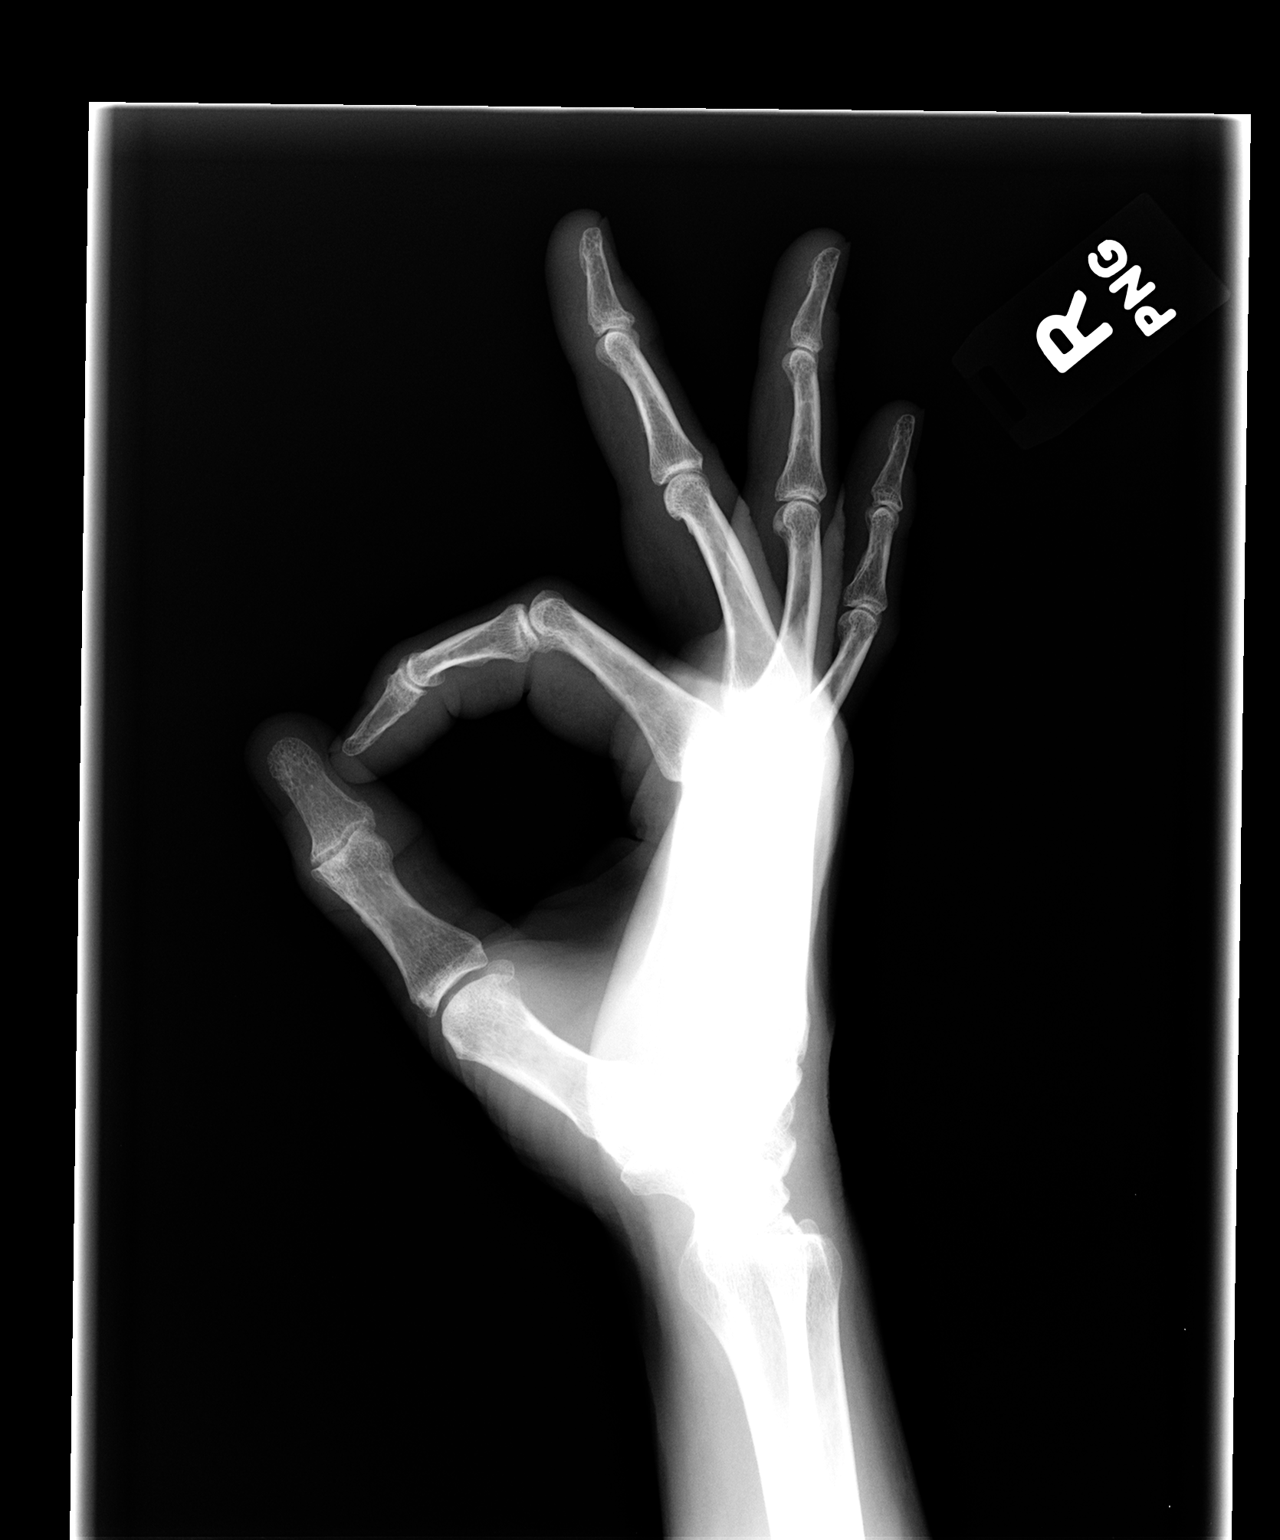

[3 of 3 positions shown; findings below may reference images not displayed]

FINDINGS: Diffuse mild degenerative changes noted about the wrist and hand. No
focal lesion identified. No acute fracture.
IMPRESSION: No acute abnormality.  Diffuse mild degenerative change.

## 2016-10-05 ENCOUNTER — Other Ambulatory Visit: Payer: Self-pay | Admitting: Internal Medicine

## 2017-02-25 DIAGNOSIS — B350 Tinea barbae and tinea capitis: Secondary | ICD-10-CM | POA: Diagnosis not present

## 2017-05-18 DIAGNOSIS — F4321 Adjustment disorder with depressed mood: Secondary | ICD-10-CM | POA: Diagnosis not present

## 2017-05-26 ENCOUNTER — Encounter: Payer: Self-pay | Admitting: Internal Medicine

## 2017-05-26 ENCOUNTER — Ambulatory Visit (INDEPENDENT_AMBULATORY_CARE_PROVIDER_SITE_OTHER): Payer: BLUE CROSS/BLUE SHIELD | Admitting: Internal Medicine

## 2017-05-26 VITALS — BP 124/88 | HR 63 | Temp 98.4°F | Ht 71.0 in | Wt 163.0 lb

## 2017-05-26 DIAGNOSIS — F524 Premature ejaculation: Secondary | ICD-10-CM | POA: Diagnosis not present

## 2017-05-26 DIAGNOSIS — N529 Male erectile dysfunction, unspecified: Secondary | ICD-10-CM | POA: Insufficient documentation

## 2017-05-26 DIAGNOSIS — F419 Anxiety disorder, unspecified: Secondary | ICD-10-CM | POA: Diagnosis not present

## 2017-05-26 MED ORDER — SILDENAFIL CITRATE 100 MG PO TABS: 50.0000 mg | ORAL_TABLET | Freq: Every day | ORAL | 11 refills | Status: DC | PRN

## 2017-05-26 MED ORDER — CITALOPRAM HYDROBROMIDE 20 MG PO TABS: 20.0000 mg | ORAL_TABLET | Freq: Every day | ORAL | 3 refills | Status: DC

## 2017-05-26 NOTE — Patient Instructions (Addendum)
Please take all new medication as prescribed - the celexa 20 mg per day, and the viagra as needed  Please continue all other medications as before, and refills have been done if requested.  Please have the pharmacy call with any other refills you may need.  Please keep your appointments with your specialists as you may have planned

## 2017-05-26 NOTE — Progress Notes (Signed)
Subjective:    Patient ID: Todd Sims, male    DOB: 06-03-1965, 52 y.o.   MRN: [id number]  HPI   Here to f/u; overall doing ok,  Pt denies chest pain, increasing sob or doe, wheezing, orthopnea, PND, increased LE swelling, palpitations, dizziness or syncope.  Pt denies new neurological symptoms such as new headache, or facial or extremity weakness or numbness.  Pt denies polydipsia, polyuria, or low sugar episode.  Pt states overall good compliance with meds, except did stop taking the lexapro 10 about 6 mo ago due to mild lower mental sharpness, may be somewhat better off the med, but has been quite difficult off the med with coping difficulty, seeing therapist. Denies worsening depressive symptoms, suicidal ideation, or panic; has ongoing anxiety. Wants to restart medicaiton.  No missed work but work is overwhelming.  Has not increased ETOH use over occasional and no illicit drug use.  Also c/o worsening ED symptoms x 6 mo, asks for new viagra prn. Past Medical History:  Diagnosis Date  . Allergic rhinitis   . ALLERGIC RHINITIS 08/26/2007  . Anxiety 08/06/2010  . Cervical neck pain with evidence of disc disease 08/06/2010  . Chronic prostatitis 08/07/2011  . Degenerative disk disease    lumbar and C- spine  . GERD 04/18/2010  . GERD (gastroesophageal reflux disease)   . Hyperlipemia   . HYPERLIPIDEMIA 08/26/2007  . Neck pain   . Premature ejaculation 08/06/2010  . Shoulder pain, right   . Thoracic outlet syndrome 08/07/2011   No past surgical history on file.  reports that  has never smoked. he has never used smokeless tobacco. He reports that he drinks about 1.2 oz of alcohol per week. He reports that he does not use drugs. family history includes Colon polyps in his father; Lung cancer in his mother. No Known Allergies No current outpatient medications on file prior to visit.   No current facility-administered medications on file prior to visit.    Review of Systems  Constitutional:  Negative for other unusual diaphoresis or sweats HENT: Negative for ear discharge or swelling Eyes: Negative for other worsening visual disturbances Respiratory: Negative for stridor or other swelling  Gastrointestinal: Negative for worsening distension or other blood Genitourinary: Negative for retention or other urinary change Musculoskeletal: Negative for other MSK pain or swelling Skin: Negative for color change or other new lesions Neurological: Negative for worsening tremors and other numbness  Psychiatric/Behavioral: Negative for worsening agitation or other fatigue All other system neg per pt    Objective:   Physical Exam BP 124/88   Pulse 63   Temp 98.4 F (36.9 C) (Oral)   Ht 5\' 11"  (1.803 m)   Wt 163 lb (73.9 kg)   SpO2 98%   BMI 22.73 kg/m  VS noted,  Constitutional: Pt appears in NAD HENT: Head: NCAT.  Right Ear: External ear normal.  Left Ear: External ear normal.  Eyes: . Pupils are equal, round, and reactive to light. Conjunctivae and EOM are normal Nose: without d/c or deformity Neck: Neck supple. Gross normal ROM Cardiovascular: Normal rate and regular rhythm.   Pulmonary/Chest: Effort normal and breath sounds without rales or wheezing.  Neurological: Pt is alert. At baseline orientation, motor grossly intact Skin: Skin is warm. No rashes, other new lesions, no LE edema Psychiatric: Pt behavior is normal without agitation but 2+ nervous No other exam findings Lab Results  Component Value Date   WBC 4.2 01/01/2016   HGB 15.2 01/01/2016  HCT 43.5 01/01/2016   PLT 163.0 01/01/2016   GLUCOSE 75 01/01/2016   CHOL 211 (H) 01/01/2016   TRIG 75.0 01/01/2016   HDL 46.00 01/01/2016   LDLDIRECT 137.4 08/04/2011   LDLCALC 150 (H) 01/01/2016   ALT 46 01/01/2016   AST 42 (H) 01/01/2016   NA 142 01/01/2016   K 4.3 01/01/2016   CL 106 01/01/2016   CREATININE 0.96 01/01/2016   BUN 18 01/01/2016   CO2 29 01/01/2016   TSH 1.21 01/01/2016   PSA 0.59 01/01/2016        Assessment & Plan:

## 2017-05-26 NOTE — Assessment & Plan Note (Signed)
May also improve as celexa side effect,  to f/u any worsening symptoms or concerns

## 2017-05-26 NOTE — Assessment & Plan Note (Signed)
Mod to severe persistent without panic, intolerant lexapro as above, for celexa 20 qd, consider 40 for uncontrolled symptoms, continue counseling

## 2017-05-26 NOTE — Assessment & Plan Note (Signed)
With recent worsening, for viagra prn,  to f/u any worsening symptoms or concerns

## 2017-05-27 DIAGNOSIS — F4321 Adjustment disorder with depressed mood: Secondary | ICD-10-CM | POA: Diagnosis not present

## 2017-06-03 DIAGNOSIS — F410 Panic disorder [episodic paroxysmal anxiety] without agoraphobia: Secondary | ICD-10-CM | POA: Diagnosis not present

## 2017-06-07 ENCOUNTER — Encounter: Payer: Self-pay | Admitting: Internal Medicine

## 2017-06-07 MED ORDER — SERTRALINE HCL 50 MG PO TABS
50.0000 mg | ORAL_TABLET | Freq: Every day | ORAL | 3 refills | Status: DC
Start: 2017-06-07 — End: 2017-07-06

## 2017-06-07 MED ORDER — CLONAZEPAM 0.5 MG PO TABS: 0.5000 mg | ORAL_TABLET | Freq: Two times a day (BID) | ORAL | 1 refills | Status: DC | PRN

## 2017-06-07 NOTE — Telephone Encounter (Signed)
Ok to let pt know  OK to change the celexa to milder Zoloft 50 mg per day  I also sent a small rx to [facility] for klonopin to only take for severe anxiety as needed

## 2017-06-10 DIAGNOSIS — F410 Panic disorder [episodic paroxysmal anxiety] without agoraphobia: Secondary | ICD-10-CM | POA: Diagnosis not present

## 2017-06-24 DIAGNOSIS — F410 Panic disorder [episodic paroxysmal anxiety] without agoraphobia: Secondary | ICD-10-CM | POA: Diagnosis not present

## 2017-07-06 ENCOUNTER — Other Ambulatory Visit (INDEPENDENT_AMBULATORY_CARE_PROVIDER_SITE_OTHER): Payer: BLUE CROSS/BLUE SHIELD

## 2017-07-06 ENCOUNTER — Encounter: Payer: Self-pay | Admitting: Internal Medicine

## 2017-07-06 ENCOUNTER — Ambulatory Visit (INDEPENDENT_AMBULATORY_CARE_PROVIDER_SITE_OTHER): Payer: BLUE CROSS/BLUE SHIELD | Admitting: Internal Medicine

## 2017-07-06 VITALS — BP 120/86 | HR 68 | Temp 98.3°F | Ht 71.0 in | Wt 161.0 lb

## 2017-07-06 DIAGNOSIS — F419 Anxiety disorder, unspecified: Secondary | ICD-10-CM

## 2017-07-06 DIAGNOSIS — Z0000 Encounter for general adult medical examination without abnormal findings: Secondary | ICD-10-CM

## 2017-07-06 LAB — BASIC METABOLIC PANEL
BUN: 18 mg/dL (ref 6–23)
CO2: 29 mEq/L (ref 19–32)
Calcium: 9.2 mg/dL (ref 8.4–10.5)
Chloride: 104 mEq/L (ref 96–112)
Creatinine, Ser: 0.89 mg/dL (ref 0.40–1.50)
GFR: 95.94 mL/min (ref >60.00)
Glucose, Bld: 95 mg/dL (ref 70–99)
Potassium: 4.6 mEq/L (ref 3.5–5.1)
Sodium: 140 mEq/L (ref 135–145)

## 2017-07-06 LAB — CBC WITH DIFFERENTIAL/PLATELET
Basophils Absolute: 0 10*3/uL (ref 0.0–0.1)
Basophils Relative: 0.7 % (ref 0.0–3.0)
Eosinophils Absolute: 0.1 10*3/uL (ref 0.0–0.7)
Eosinophils Relative: 1.2 % (ref 0.0–5.0)
HCT: 42.3 % (ref 39.0–52.0)
Hemoglobin: 14.9 g/dL (ref 13.0–17.0)
Lymphocytes Relative: 33.1 % (ref 12.0–46.0)
Lymphs Abs: 1.5 10*3/uL (ref 0.7–4.0)
MCHC: 35.3 g/dL (ref 30.0–36.0)
MCV: 93.8 fl (ref 78.0–100.0)
Monocytes Absolute: 0.4 10*3/uL (ref 0.1–1.0)
Monocytes Relative: 8.6 % (ref 3.0–12.0)
Neutro Abs: 2.5 10*3/uL (ref 1.4–7.7)
Neutrophils Relative %: 56.4 % (ref 43.0–77.0)
Platelets: 140 10*3/uL — ABNORMAL LOW (ref 150.0–400.0)
RBC: 4.51 Mil/uL (ref 4.22–5.81)
RDW: 12.5 % (ref 11.5–15.5)
WBC: 4.5 10*3/uL (ref 4.0–10.5)

## 2017-07-06 LAB — URINALYSIS, ROUTINE W REFLEX MICROSCOPIC
Bilirubin Urine: NEGATIVE
Hgb urine dipstick: NEGATIVE
Ketones, ur: NEGATIVE
Leukocytes, UA: NEGATIVE
Nitrite: NEGATIVE
RBC / HPF: NONE SEEN (ref 0–?)
Specific Gravity, Urine: 1.015 (ref 1.000–1.030)
Total Protein, Urine: NEGATIVE
Urine Glucose: NEGATIVE
Urobilinogen, UA: 0.2 (ref 0.0–1.0)
pH: 6.5 (ref 5.0–8.0)

## 2017-07-06 LAB — LIPID PANEL
Cholesterol: 182 mg/dL (ref 0–200)
HDL: 50.3 mg/dL (ref >39.00)
LDL Cholesterol: 117 mg/dL — ABNORMAL HIGH (ref 0–99)
NonHDL: 131.95
Total CHOL/HDL Ratio: 4
Triglycerides: 75 mg/dL (ref 0.0–149.0)
VLDL: 15 mg/dL (ref 0.0–40.0)

## 2017-07-06 LAB — HEPATIC FUNCTION PANEL
ALT: 32 U/L (ref 0–53)
AST: 35 U/L (ref 0–37)
Albumin: 4.3 g/dL (ref 3.5–5.2)
Alkaline Phosphatase: 56 U/L (ref 39–117)
Bilirubin, Direct: 0.1 mg/dL (ref 0.0–0.3)
Total Bilirubin: 0.9 mg/dL (ref 0.2–1.2)
Total Protein: 6.8 g/dL (ref 6.0–8.3)

## 2017-07-06 LAB — PSA: PSA: 0.86 ng/mL (ref 0.10–4.00)

## 2017-07-06 LAB — TSH: TSH: 0.69 u[IU]/mL (ref 0.35–4.50)

## 2017-07-06 MED ORDER — SILDENAFIL CITRATE 100 MG PO TABS: 50.0000 mg | ORAL_TABLET | Freq: Every day | ORAL | 11 refills | Status: DC | PRN

## 2017-07-06 MED ORDER — SERTRALINE HCL 100 MG PO TABS: 100.0000 mg | ORAL_TABLET | Freq: Every day | ORAL | 3 refills | Status: DC

## 2017-07-06 NOTE — Progress Notes (Signed)
Subjective:    Patient ID: Todd Sims, male    DOB: November 12, 1965, 52 y.o.   MRN: [id number]  HPI   Here for wellness and f/u;  Overall doing ok;  Pt denies Chest pain, worsening SOB, DOE, wheezing, orthopnea, PND, worsening LE edema, palpitations, dizziness or syncope.  Pt denies neurological change such as new headache, facial or extremity weakness.  Pt denies polydipsia, polyuria, or low sugar symptoms. Pt states overall good compliance with treatment and medications, good tolerability, and has been trying to follow appropriate diet.  Pt denies worsening depressive symptoms, suicidal ideation or panic. No fever, night sweats, wt loss, loss of appetite, or other constitutional symptoms.  Pt states good ability with ADL's, has low fall risk, home safety reviewed and adequate, no other significant changes in hearing or vision, and occasionally active with exercise but less than in years past.  Did start the zoloft about 2 wks, upped himself to 75, needs more, not using klonopin so far but has on hand, and therapy helps.  Feeling of hopeless have been better but still nervous. Declines flu shot. Past Medical History:  Diagnosis Date  . Allergic rhinitis   . ALLERGIC RHINITIS 08/26/2007  . Anxiety 08/06/2010  . Cervical neck pain with evidence of disc disease 08/06/2010  . Chronic prostatitis 08/07/2011  . Degenerative disk disease    lumbar and C- spine  . GERD 04/18/2010  . GERD (gastroesophageal reflux disease)   . Hyperlipemia   . HYPERLIPIDEMIA 08/26/2007  . Neck pain   . Premature ejaculation 08/06/2010  . Shoulder pain, right   . Thoracic outlet syndrome 08/07/2011   No past surgical history on file.  reports that he has never smoked. He has never used smokeless tobacco. He reports that he drinks about 1.2 oz of alcohol per week. He reports that he does not use drugs. family history includes Colon polyps in his father; Lung cancer in his mother. No Known Allergies Current Outpatient  Medications on File Prior to Visit  Medication Sig Dispense Refill  . clonazePAM (KLONOPIN) 0.5 MG tablet Take 1 tablet (0.5 mg total) by mouth 2 (two) times daily as needed for anxiety. 30 tablet 1   No current facility-administered medications on file prior to visit.    Review of Systems Constitutional: Negative for other unusual diaphoresis, sweats, appetite or weight changes HENT: Negative for other worsening hearing loss, ear pain, facial swelling, mouth sores or neck stiffness.   Eyes: Negative for other worsening pain, redness or other visual disturbance.  Respiratory: Negative for other stridor or swelling Cardiovascular: Negative for other palpitations or other chest pain  Gastrointestinal: Negative for worsening diarrhea or loose stools, blood in stool, distention or other pain Genitourinary: Negative for hematuria, flank pain or other change in urine volume.  Musculoskeletal: Negative for myalgias or other joint swelling.  Skin: Negative for other color change, or other wound or worsening drainage.  Neurological: Negative for other syncope or numbness. Hematological: Negative for other adenopathy or swelling Psychiatric/Behavioral: Negative for hallucinations, other worsening agitation, SI, self-injury, or new decreased concentration All other system neg per pt    Objective:   Physical Exam BP 120/86   Pulse 68   Temp 98.3 F (36.8 C) (Oral)   Ht 5\' 11"  (1.803 m)   Wt 161 lb (73 kg)   SpO2 98%   BMI 22.45 kg/m  VS noted,  Constitutional: Pt is oriented to person, place, and time. Appears well-developed and well-nourished, in no significant  distress and comfortable Head: Normocephalic and atraumatic  Eyes: Conjunctivae and EOM are normal. Pupils are equal, round, and reactive to light Right Ear: External ear normal without discharge Left Ear: External ear normal without discharge Nose: Nose without discharge or deformity Mouth/Throat: Oropharynx is without other  ulcerations and moist  Neck: Normal range of motion. Neck supple. No JVD present. No tracheal deviation present or significant neck LA or mass Cardiovascular: Normal rate, regular rhythm, normal heart sounds and intact distal pulses.   Pulmonary/Chest: WOB normal and breath sounds without rales or wheezing  Abdominal: Soft. Bowel sounds are normal. NT. No HSM  Musculoskeletal: Normal range of motion. Exhibits no edema Lymphadenopathy: Has no other cervical adenopathy.  Neurological: Pt is alert and oriented to person, place, and time. Pt has normal reflexes. No cranial nerve deficit. Motor grossly intact, Gait intact Skin: Skin is warm and dry. No rash noted or new ulcerations Psychiatric:  Has nervous mood and affect. Behavior is normal without agitation No other exam findings    Assessment & Plan:

## 2017-07-06 NOTE — Patient Instructions (Addendum)
You will be contacted regarding the referral for: colonoscopy  OK to increase the Zoloft to 100 mg per day  Please continue all other medications as before, and refills have been done if requested.  Please have the pharmacy call with any other refills you may need.  Please continue your efforts at being more active, low cholesterol diet, and weight control.  You are otherwise up to date with prevention measures today.  Please keep your appointments with your specialists as you may have planned  Please go to the LAB in the Basement (turn left off the elevator) for the tests to be done today  You will be contacted by phone if any changes need to be made immediately.  Otherwise, you will receive a letter about your results with an explanation, but please check with MyChart first.  Please remember to sign up for MyChart if you have not done so, as this will be important to you in the future with finding out test results, communicating by private email, and scheduling acute appointments online when needed.  Please return in 1 year for your yearly visit, or sooner if needed, with Lab testing done 3-5 days before

## 2017-07-07 NOTE — Assessment & Plan Note (Addendum)
[place]lin for increased zoloft to 100 qd

## 2017-07-07 NOTE — Assessment & Plan Note (Signed)

## 2017-07-08 DIAGNOSIS — F410 Panic disorder [episodic paroxysmal anxiety] without agoraphobia: Secondary | ICD-10-CM | POA: Diagnosis not present

## 2017-07-19 DIAGNOSIS — F411 Generalized anxiety disorder: Secondary | ICD-10-CM | POA: Diagnosis not present

## 2017-07-19 DIAGNOSIS — F329 Major depressive disorder, single episode, unspecified: Secondary | ICD-10-CM | POA: Diagnosis not present

## 2017-07-21 ENCOUNTER — Encounter: Payer: Self-pay | Admitting: Gastroenterology

## 2017-07-25 ENCOUNTER — Encounter: Payer: Self-pay | Admitting: Internal Medicine

## 2017-07-27 DIAGNOSIS — F410 Panic disorder [episodic paroxysmal anxiety] without agoraphobia: Secondary | ICD-10-CM | POA: Diagnosis not present

## 2017-08-09 DIAGNOSIS — F4323 Adjustment disorder with mixed anxiety and depressed mood: Secondary | ICD-10-CM | POA: Diagnosis not present

## 2017-08-11 DIAGNOSIS — F410 Panic disorder [episodic paroxysmal anxiety] without agoraphobia: Secondary | ICD-10-CM | POA: Diagnosis not present

## 2017-08-16 DIAGNOSIS — F4323 Adjustment disorder with mixed anxiety and depressed mood: Secondary | ICD-10-CM | POA: Diagnosis not present

## 2017-08-18 DIAGNOSIS — F329 Major depressive disorder, single episode, unspecified: Secondary | ICD-10-CM | POA: Diagnosis not present

## 2017-08-23 DIAGNOSIS — F4323 Adjustment disorder with mixed anxiety and depressed mood: Secondary | ICD-10-CM | POA: Diagnosis not present

## 2017-08-26 ENCOUNTER — Other Ambulatory Visit: Payer: Self-pay

## 2017-08-26 ENCOUNTER — Ambulatory Visit (AMBULATORY_SURGERY_CENTER): Payer: Self-pay

## 2017-08-26 VITALS — Ht 71.0 in | Wt 161.2 lb

## 2017-08-26 DIAGNOSIS — Z1211 Encounter for screening for malignant neoplasm of colon: Secondary | ICD-10-CM

## 2017-08-26 MED ORDER — NA SULFATE-K SULFATE-MG SULF 17.5-3.13-1.6 GM/177ML PO SOLN: 1.0000 | Freq: Once | ORAL | 0 refills | Status: AC

## 2017-08-26 NOTE — Progress Notes (Signed)
Denies allergies to eggs or soy products. Denies complication of anesthesia or sedation. Denies use of weight loss medication. Denies use of O2.   Emmi instructions declined.   Patient was given the option to have Suprep or Golytely and he chose Suprep. Patient was informed that I did not know what the cost would be with [employer]. A 15.00 coupon was given to the patient.

## 2017-08-30 DIAGNOSIS — F4323 Adjustment disorder with mixed anxiety and depressed mood: Secondary | ICD-10-CM | POA: Diagnosis not present

## 2017-08-31 ENCOUNTER — Encounter: Payer: Self-pay | Admitting: Gastroenterology

## 2017-09-01 ENCOUNTER — Encounter: Payer: Self-pay | Admitting: Gastroenterology

## 2017-09-07 ENCOUNTER — Ambulatory Visit (AMBULATORY_SURGERY_CENTER): Payer: BLUE CROSS/BLUE SHIELD | Admitting: Gastroenterology

## 2017-09-07 ENCOUNTER — Encounter: Payer: Self-pay | Admitting: Gastroenterology

## 2017-09-07 ENCOUNTER — Other Ambulatory Visit: Payer: Self-pay

## 2017-09-07 VITALS — BP 120/75 | HR 55 | Temp 98.4°F | Resp 19 | Ht 71.0 in | Wt 161.0 lb

## 2017-09-07 DIAGNOSIS — D124 Benign neoplasm of descending colon: Secondary | ICD-10-CM | POA: Diagnosis not present

## 2017-09-07 DIAGNOSIS — D125 Benign neoplasm of sigmoid colon: Secondary | ICD-10-CM

## 2017-09-07 DIAGNOSIS — D123 Benign neoplasm of transverse colon: Secondary | ICD-10-CM

## 2017-09-07 DIAGNOSIS — Z1211 Encounter for screening for malignant neoplasm of colon: Secondary | ICD-10-CM

## 2017-09-07 DIAGNOSIS — D122 Benign neoplasm of ascending colon: Secondary | ICD-10-CM | POA: Diagnosis not present

## 2017-09-07 MED ORDER — DICYCLOMINE HCL 20 MG PO TABS: ORAL_TABLET | ORAL | 3 refills | Status: DC

## 2017-09-07 MED ORDER — SODIUM CHLORIDE 0.9 % IV SOLN: 500.0000 mL | Freq: Once | INTRAVENOUS | Status: DC

## 2017-09-07 NOTE — Patient Instructions (Signed)
NO ASPIRIN, ASPIRIN CONTAINING PRODUCTS (BC OR GOODY POWDERS) OR NSAIDS (IBUPROFEN, ADVIL, ALEVE, AND MOTRIN) FOR 2 weeks, June 11,2019; TYLENOL IS OK TO TAKE  Handouts given: Polyps and Hemorrhoids.  YOU HAD AN ENDOSCOPIC PROCEDURE TODAY AT [facility]:   Refer to the procedure report that was given to you for any specific questions about what was found during the examination.  If the procedure report does not answer your questions, please call your gastroenterologist to clarify.  If you requested that your care partner not be given the details of your procedure findings, then the procedure report has been included in a sealed envelope for you to review at your convenience later.  YOU SHOULD EXPECT: Some feelings of bloating in the abdomen. Passage of more gas than usual.  Walking can help get rid of the air that was put into your GI tract during the procedure and reduce the bloating. If you had a lower endoscopy (such as a colonoscopy or flexible sigmoidoscopy) you may notice spotting of blood in your stool or on the toilet paper. If you underwent a bowel prep for your procedure, you may not have a normal bowel movement for a few days.  Please Note:  You might notice some irritation and congestion in your nose or some drainage.  This is from the oxygen used during your procedure.  There is no need for concern and it should clear up in a day or so.  SYMPTOMS TO REPORT IMMEDIATELY:   Following lower endoscopy (colonoscopy or flexible sigmoidoscopy):  Excessive amounts of blood in the stool  Significant tenderness or worsening of abdominal pains  Swelling of the abdomen that is new, acute  Fever of 100F or higher   For urgent or emergent issues, a gastroenterologist can be reached at any hour by calling [phone].   DIET:  We do recommend a small meal at first, but then you may proceed to your regular diet.  Drink plenty of fluids but you should avoid alcoholic beverages  for 24 hours.  ACTIVITY:  You should plan to take it easy for the rest of today and you should NOT DRIVE or use heavy machinery until tomorrow (because of the sedation medicines used during the test).    FOLLOW UP: Our staff will call the number listed on your records the next business day following your procedure to check on you and address any questions or concerns that you may have regarding the information given to you following your procedure. If we do not reach you, we will leave a message.  However, if you are feeling well and you are not experiencing any problems, there is no need to return our call.  We will assume that you have returned to your regular daily activities without incident.  If any biopsies were taken you will be contacted by phone or by letter within the next 1-3 weeks.  Please call us at [phone] if you have not heard about the biopsies in 3 weeks.    SIGNATURES/CONFIDENTIALITY: You and/or your care partner have signed paperwork which will be entered into your electronic medical record.  These signatures attest to the fact that that the information above on your After Visit Summary has been reviewed and is understood.  Full responsibility of the confidentiality of this discharge information lies with you and/or your care-partner.

## 2017-09-07 NOTE — Op Note (Addendum)
[place] Patient Name: Todd Sims Procedure Date: 09/07/2017 3:17 PM MRN: [id number] Endoscopist: Ladene Artist , MD Age: 52 Referring MD:  Date of Birth: [birth year] Gender: Male Account #: [id number] Procedure:                Colonoscopy Indications:              Screening for colorectal malignant neoplasm Medicines:                Monitored Anesthesia Care Procedure:                Pre-Anesthesia Assessment:                           - Prior to the procedure, a History and Physical                            was performed, and patient medications and                            allergies were reviewed. The patient's tolerance of                            previous anesthesia was also reviewed. The risks                            and benefits of the procedure and the sedation                            options and risks were discussed with the patient.                            All questions were answered, and informed consent                            was obtained. Prior Anticoagulants: The patient has                            taken no previous anticoagulant or antiplatelet                            agents. ASA Grade Assessment: II - A patient with                            mild systemic disease. After reviewing the risks                            and benefits, the patient was deemed in                            satisfactory condition to undergo the procedure.                           After obtaining informed consent, the colonoscope  was passed under direct vision. Throughout the                            procedure, the patient's blood pressure, pulse, and                            oxygen saturations were monitored continuously. The                            Model PCF-[id number]) scope was introduced                            through the anus and advanced to the the cecum,                            identified by  appendiceal orifice and ileocecal                            valve. The ileocecal valve, appendiceal orifice,                            and rectum were photographed. The quality of the                            bowel preparation was good after lavage and                            sutioning. The colonoscopy was performed without                            difficulty. The patient tolerated the procedure                            well. Scope In: 3:25:44 PM Scope Out: 3:43:31 PM Scope Withdrawal Time: 0 hours 14 minutes 58 seconds  Total Procedure Duration: 0 hours 17 minutes 47 seconds  Findings:                 The perianal and digital rectal examinations were                            normal.                           A 10 mm polyp was found in the descending colon.                            The polyp was semi-pedunculated. The polyp was                            removed with a hot snare. Resection and retrieval                            were complete.  Three sessile polyps were found in the sigmoid                            colon, transverse colon and ascending colon. The                            polyps were 6 to 8 mm in size. These polyps were                            removed with a cold snare. Resection and retrieval                            were complete.                           Internal hemorrhoids were found during                            retroflexion. The hemorrhoids were small and Grade                            I (internal hemorrhoids that do not prolapse).                           The exam was otherwise without abnormality on                            direct and retroflexion views. Complications:            No immediate complications. Estimated blood loss:                            None. Estimated Blood Loss:     Estimated blood loss: none. Impression:               - One 10 mm polyp in the descending colon, removed                             with a hot snare. Resected and retrieved.                           - Three 6 to 8 mm polyps in the sigmoid colon, in                            the transverse colon and in the ascending colon,                            removed with a cold snare. Resected and retrieved.                           - Internal hemorrhoids.                           - The examination was otherwise normal on direct  and retroflexion views. Recommendation:           - Repeat colonoscopy in 3 years for surveillance,                            pending pathology review.                           - Patient has a contact number available for                            emergencies. The signs and symptoms of potential                            delayed complications were discussed with the                            patient. Return to normal activities tomorrow.                            Written discharge instructions were provided to the                            patient.                           - Resume previous diet.                           - Continue present medications.                           - Await pathology results.                           - No aspirin, ibuprofen, naproxen, or other                            non-steroidal anti-inflammatory drugs for 2 weeks                            after polyp removal.                           - Bentyl (dicyclomine) 20 mg PO BID 30 min AC,                            refills for 3 months. Ladene Artist, MD 09/07/2017 3:47:31 PM This report has been signed electronically.

## 2017-09-07 NOTE — Progress Notes (Signed)
Report to PACU, RN, vss, BBS= Clear.  

## 2017-09-07 NOTE — Progress Notes (Signed)
No changes in medical or surgical hx since PV per pt 

## 2017-09-07 NOTE — Progress Notes (Signed)
Called to room to assist during endoscopic procedure.  Patient ID and intended procedure confirmed with present staff. Received instructions for my participation in the procedure from the performing physician.  

## 2017-09-08 ENCOUNTER — Telehealth: Payer: Self-pay

## 2017-09-08 NOTE — Telephone Encounter (Signed)
  Follow up Call-  Call back number 09/07/2017  Post procedure Call Back phone  # [phone]  Permission to leave phone message Yes  Some recent data might be hidden     Patient questions:  Do you have a fever, pain , or abdominal swelling? No. Pain Score  0 *  Have you tolerated food without any problems? Yes.    Have you been able to return to your normal activities? Yes.  Do you have any questions about your discharge instructions: Diet   No. Medications  No. Follow up visit  No.  Do you have questions or concerns about your Care? Yes.    Actions: * If pain score is 4 or above: No action needed, pain <4.  Pt reported he woke up at 3:00 am and  6:30 am with nausea , indigestion,and body sweats.    He ate a sandwich, potatoe salad for dinner and ate toast before bed.  He was on his way to work when I spoke to pt this am.  Only drank coffee this am so far.  I asked how he felt now he said he was ok now but wondered why he woke up during the night with those sx.  I advised him I did not think the sx were from his colonoscopy or the sedation.  I asked if he had any rectal bleeding and pt denied. I asked pt to take his temperature, he said he would.  He said ' "I don't think I have a temperature.  I asked pt to please call us back if his sx are not resolved today.  Pt said he would call back if he needs [place]. maw

## 2017-09-09 ENCOUNTER — Encounter: Payer: BLUE CROSS/BLUE SHIELD | Admitting: Gastroenterology

## 2017-09-15 ENCOUNTER — Encounter: Payer: Self-pay | Admitting: Gastroenterology

## 2017-09-15 DIAGNOSIS — F410 Panic disorder [episodic paroxysmal anxiety] without agoraphobia: Secondary | ICD-10-CM | POA: Diagnosis not present

## 2017-12-06 DIAGNOSIS — S92355A Nondisplaced fracture of fifth metatarsal bone, left foot, initial encounter for closed fracture: Secondary | ICD-10-CM | POA: Diagnosis not present

## 2018-01-06 DIAGNOSIS — S92355D Nondisplaced fracture of fifth metatarsal bone, left foot, subsequent encounter for fracture with routine healing: Secondary | ICD-10-CM | POA: Diagnosis not present

## 2018-02-08 DIAGNOSIS — S92355D Nondisplaced fracture of fifth metatarsal bone, left foot, subsequent encounter for fracture with routine healing: Secondary | ICD-10-CM | POA: Diagnosis not present

## 2018-03-31 ENCOUNTER — Other Ambulatory Visit: Payer: Self-pay | Admitting: Internal Medicine

## 2018-03-31 MED ORDER — SERTRALINE HCL 100 MG PO TABS: 100.0000 mg | ORAL_TABLET | Freq: Every day | ORAL | 1 refills | Status: DC

## 2018-03-31 NOTE — Telephone Encounter (Signed)
Copied from [place] [id number]. Topic: General - Other >> Mar 31, 2018  2:49 PM Lennox Solders wrote: Reason for CRM: pt is no longer wanting to get sertraline from [facility]. Pt needs new rx sertraline 100 mg #90 w/[facility]

## 2018-03-31 NOTE — Telephone Encounter (Signed)
Requested Prescriptions  Pending Prescriptions Disp Refills  . sertraline (ZOLOFT) 100 MG tablet 90 tablet 1    Sig: Take 1 tablet (100 mg total) by mouth daily.     Psychiatry:  Antidepressants - SSRI Failed - 03/31/2018  3:09 PM      Failed - Valid encounter within last 6 months    Recent Outpatient Visits          8 months ago Preventative health care   [facility] -Georges Mouse, MD   10 months ago [facility] -Georges Mouse, MD   2 years ago Syncope, unspecified syncope type   [place] John, James W, MD   2 years ago Preventative health care   [facility] -Georges Mouse, MD   3 years ago Trigger finger, acquired   [place], [place], DO      Future Appointments            In 2 weeks Jenny Reichmann, Hunt Oris, MD [place], [facility]

## 2018-04-20 ENCOUNTER — Ambulatory Visit (INDEPENDENT_AMBULATORY_CARE_PROVIDER_SITE_OTHER): Payer: BLUE CROSS/BLUE SHIELD | Admitting: Internal Medicine

## 2018-04-20 ENCOUNTER — Encounter: Payer: Self-pay | Admitting: Internal Medicine

## 2018-04-20 VITALS — BP 122/78 | HR 75 | Temp 98.0°F | Ht 71.0 in | Wt 171.0 lb

## 2018-04-20 DIAGNOSIS — E785 Hyperlipidemia, unspecified: Secondary | ICD-10-CM | POA: Diagnosis not present

## 2018-04-20 DIAGNOSIS — J069 Acute upper respiratory infection, unspecified: Secondary | ICD-10-CM | POA: Diagnosis not present

## 2018-04-20 DIAGNOSIS — F419 Anxiety disorder, unspecified: Secondary | ICD-10-CM | POA: Diagnosis not present

## 2018-04-20 DIAGNOSIS — Z0001 Encounter for general adult medical examination with abnormal findings: Secondary | ICD-10-CM

## 2018-04-20 DIAGNOSIS — Z114 Encounter for screening for human immunodeficiency virus [HIV]: Secondary | ICD-10-CM

## 2018-04-20 DIAGNOSIS — Z0000 Encounter for general adult medical examination without abnormal findings: Secondary | ICD-10-CM

## 2018-04-20 MED ORDER — SERTRALINE HCL 100 MG PO TABS: 100.0000 mg | ORAL_TABLET | Freq: Every day | ORAL | 3 refills | Status: DC

## 2018-04-20 MED ORDER — AZITHROMYCIN 250 MG PO TABS: ORAL_TABLET | ORAL | 1 refills | Status: DC

## 2018-04-20 NOTE — Assessment & Plan Note (Addendum)
Mild to mod, for antibx course,  to f/u any worsening symptoms or concerns  In addition to the time spent performing CPE, I spent an additional 15 minutes face to face,in which greater than 50% of this time was spent in counseling and coordination of care for patient's illness as documented, including the differential dx, treatment, further evaluation and other management of acute URI, anxiety, and HLD

## 2018-04-20 NOTE — Progress Notes (Signed)
Subjective:    Patient ID: Todd Sims, male    DOB: [birth year], 53 y.o.   MRN: [id number]  HPI  Here for wellness and f/u;  Overall doing ok;   Here with 2-3 days acute onset fever, facial pain, pressure, headache, general weakness and malaise, and greenish d/c, with mild ST and cough, but Pt denies Chest pain, worsening SOB, DOE, wheezing, orthopnea, PND, worsening LE edema, palpitations, dizziness or syncope.    Pt denies neurological change such as new headache, facial or extremity weakness.  Pt denies polydipsia, polyuria, or low sugar symptoms. Pt states overall good compliance with treatment and medications, good tolerability, and has been trying to follow appropriate diet.  Pt denies worsening depressive symptoms, suicidal ideation or panic. No fever, night sweats, wt loss, loss of appetite, or other constitutional symptoms.  Pt states good ability with ADL's, has low fall risk, home safety reviewed and adequate, no other significant changes in hearing or vision, and only occasionally active with exercise. Declines flu shot.  No new complaints, Needs med refills. Past Medical History:  Diagnosis Date  . Allergic rhinitis   . ALLERGIC RHINITIS 08/26/2007  . Allergy   . Anxiety 08/06/2010  . Cervical neck pain with evidence of disc disease 08/06/2010  . Chronic prostatitis 08/07/2011  . Degenerative disk disease    lumbar and C- spine  . Depression   . GERD 04/18/2010  . GERD (gastroesophageal reflux disease)   . Hyperlipemia   . HYPERLIPIDEMIA 08/26/2007  . Neck pain   . Premature ejaculation 08/06/2010  . Shoulder pain, right   . Thoracic outlet syndrome 08/07/2011   Past Surgical History:  Procedure Laterality Date  . THORACIC OUTLET SURGERY Right 2014    reports that he has never smoked. He has never used smokeless tobacco. He reports current alcohol use of about 2.0 standard drinks of alcohol per week. He reports that he does not use drugs. family history includes Colon polyps  in his father; Lung cancer in his mother. No Known Allergies Current Outpatient Medications on File Prior to Visit  Medication Sig Dispense Refill  . clonazePAM (KLONOPIN) 0.5 MG tablet Take 1 tablet (0.5 mg total) by mouth 2 (two) times daily as needed for anxiety. 30 tablet 1  . dicyclomine (BENTYL) 20 MG tablet 20 mg by mouth twice daily,before breakfast and evening meal 60 tablet 3  . sildenafil (VIAGRA) 100 MG tablet Take 0.5-1 tablets (50-100 mg total) by mouth daily as needed for erectile dysfunction. 10 tablet 11   No current facility-administered medications on file prior to visit.    Review of Systems Constitutional: Negative for other unusual diaphoresis, sweats, appetite or weight changes HENT: Negative for other worsening hearing loss, ear pain, facial swelling, mouth sores or neck stiffness.   Eyes: Negative for other worsening pain, redness or other visual disturbance.  Respiratory: Negative for other stridor or swelling Cardiovascular: Negative for other palpitations or other chest pain  Gastrointestinal: Negative for worsening diarrhea or loose stools, blood in stool, distention or other pain Genitourinary: Negative for hematuria, flank pain or other change in urine volume.  Musculoskeletal: Negative for myalgias or other joint swelling.  Skin: Negative for other color change, or other wound or worsening drainage.  Neurological: Negative for other syncope or numbness. Hematological: Negative for other adenopathy or swelling Psychiatric/Behavioral: Negative for hallucinations, other worsening agitation, SI, self-injury, or new decreased concentration All other system neg per pt    Objective:   Physical Exam BP  122/78   Pulse 75   Temp 98 F (36.7 C) (Oral)   Ht 5\' 11"  (1.803 m)   Wt 171 lb (77.6 kg)   SpO2 95%   BMI 23.85 kg/m  VS noted, mild ill Constitutional: Pt is oriented to person, place, and time. Appears well-developed and well-nourished, in no significant  distress and comfortable Head: Normocephalic and atraumatic  Eyes: Conjunctivae and EOM are normal. Pupils are equal, round, and reactive to light Right Ear: External ear normal without discharge Left Ear: External ear normal without discharge Nose: Nose without discharge or deformity Bilat tm's with mild erythema.  Max sinus areas non tender.  Pharynx with mild erythema, no exudate Mouth/Throat: Oropharynx is without other ulcerations and moist  Neck: Normal range of motion. Neck supple. No JVD present. No tracheal deviation present or significant neck LA or mass Cardiovascular: Normal rate, regular rhythm, normal heart sounds and intact distal pulses.   Pulmonary/Chest: WOB normal and breath sounds without rales or wheezing  Abdominal: Soft. Bowel sounds are normal. NT. No HSM  Musculoskeletal: Normal range of motion. Exhibits no edema Lymphadenopathy: Has no other cervical adenopathy.  Neurological: Pt is alert and oriented to person, place, and time. Pt has normal reflexes. No cranial nerve deficit. Motor grossly intact, Gait intact Skin: Skin is warm and dry. No rash noted or new ulcerations Psychiatric:  Has normal mood and affect. Behavior is normal without agitation, not depressed affect No other exam findings Lab Results  Component Value Date   WBC 4.5 07/06/2017   HGB 14.9 07/06/2017   HCT 42.3 07/06/2017   PLT 140.0 (L) 07/06/2017   GLUCOSE 95 07/06/2017   CHOL 182 07/06/2017   TRIG 75.0 07/06/2017   HDL 50.30 07/06/2017   LDLDIRECT 137.4 08/04/2011   LDLCALC 117 (H) 07/06/2017   ALT 32 07/06/2017   AST 35 07/06/2017   NA 140 07/06/2017   K 4.6 07/06/2017   CL 104 07/06/2017   CREATININE 0.89 07/06/2017   BUN 18 07/06/2017   CO2 29 07/06/2017   TSH 0.69 07/06/2017   PSA 0.86 07/06/2017       Assessment & Plan:

## 2018-04-20 NOTE — Assessment & Plan Note (Signed)
For lower chol diet, declines lab f/u today

## 2018-04-20 NOTE — Patient Instructions (Signed)
Please take all new medication as prescribed - the antibiotic  Please continue all other medications as before, and refills have been done if requested - the zoloft  Please have the pharmacy call with any other refills you may need.  Please continue your efforts at being more active, low cholesterol diet, and weight control.  You are otherwise up to date with prevention measures today.  Please keep your appointments with your specialists as you may have planned  Please return in 1 year for your yearly visit, or sooner if needed, with Lab testing done 3-5 days before

## 2018-04-20 NOTE — Assessment & Plan Note (Signed)
stable overall by history and exam, recent data reviewed with pt, and pt to continue medical treatment as before,  to f/u any worsening symptoms or concerns  

## 2018-04-20 NOTE — Assessment & Plan Note (Signed)

## 2018-10-11 DIAGNOSIS — F329 Major depressive disorder, single episode, unspecified: Secondary | ICD-10-CM | POA: Diagnosis not present

## 2018-12-09 DIAGNOSIS — S90551A Superficial foreign body, right ankle, initial encounter: Secondary | ICD-10-CM | POA: Diagnosis not present

## 2018-12-09 DIAGNOSIS — S81802A Unspecified open wound, left lower leg, initial encounter: Secondary | ICD-10-CM | POA: Diagnosis not present

## 2019-01-17 DIAGNOSIS — F4320 Adjustment disorder, unspecified: Secondary | ICD-10-CM | POA: Diagnosis not present

## 2019-01-26 DIAGNOSIS — F4321 Adjustment disorder with depressed mood: Secondary | ICD-10-CM | POA: Diagnosis not present

## 2019-01-31 DIAGNOSIS — F4323 Adjustment disorder with mixed anxiety and depressed mood: Secondary | ICD-10-CM | POA: Diagnosis not present

## 2019-02-14 DIAGNOSIS — F4321 Adjustment disorder with depressed mood: Secondary | ICD-10-CM | POA: Diagnosis not present

## 2019-02-27 ENCOUNTER — Other Ambulatory Visit: Payer: Self-pay

## 2019-02-27 DIAGNOSIS — Z20822 Contact with and (suspected) exposure to covid-19: Secondary | ICD-10-CM

## 2019-03-01 ENCOUNTER — Encounter: Payer: Self-pay | Admitting: Internal Medicine

## 2019-03-01 LAB — NOVEL CORONAVIRUS, NAA: SARS-CoV-2, NAA: DETECTED — AB

## 2019-03-21 DIAGNOSIS — F4321 Adjustment disorder with depressed mood: Secondary | ICD-10-CM | POA: Diagnosis not present

## 2019-03-27 DIAGNOSIS — F329 Major depressive disorder, single episode, unspecified: Secondary | ICD-10-CM | POA: Diagnosis not present

## 2019-03-28 DIAGNOSIS — F4321 Adjustment disorder with depressed mood: Secondary | ICD-10-CM | POA: Diagnosis not present

## 2019-04-03 ENCOUNTER — Encounter: Payer: Self-pay | Admitting: Internal Medicine

## 2019-04-03 DIAGNOSIS — U071 COVID-19: Secondary | ICD-10-CM | POA: Diagnosis not present

## 2019-04-12 DIAGNOSIS — Z8619 Personal history of other infectious and parasitic diseases: Secondary | ICD-10-CM | POA: Diagnosis not present

## 2019-04-18 DIAGNOSIS — F4321 Adjustment disorder with depressed mood: Secondary | ICD-10-CM | POA: Diagnosis not present

## 2019-06-06 ENCOUNTER — Other Ambulatory Visit: Payer: Self-pay | Admitting: Internal Medicine

## 2019-06-06 DIAGNOSIS — F4321 Adjustment disorder with depressed mood: Secondary | ICD-10-CM | POA: Diagnosis not present

## 2019-06-09 ENCOUNTER — Telehealth: Payer: Self-pay

## 2019-06-09 DIAGNOSIS — Z0000 Encounter for general adult medical examination without abnormal findings: Secondary | ICD-10-CM

## 2019-06-09 NOTE — Telephone Encounter (Signed)
Patient scheduled for CPE on 06/22/2019 and would like to get labs done before this appointment. Once orders are place, please advise patient that orders are in and he can schedule.

## 2019-06-13 ENCOUNTER — Ambulatory Visit: Payer: BLUE CROSS/BLUE SHIELD | Admitting: Internal Medicine

## 2019-06-14 NOTE — Telephone Encounter (Signed)
Ok labs ordered 

## 2019-06-14 NOTE — Telephone Encounter (Signed)
Left detailed message for pt informing labs are entered and to go to the [facility] lab at his convenience.

## 2019-06-22 ENCOUNTER — Encounter: Payer: BLUE CROSS/BLUE SHIELD | Admitting: Internal Medicine

## 2019-06-27 ENCOUNTER — Other Ambulatory Visit (INDEPENDENT_AMBULATORY_CARE_PROVIDER_SITE_OTHER): Payer: BC Managed Care – PPO

## 2019-06-27 DIAGNOSIS — Z0000 Encounter for general adult medical examination without abnormal findings: Secondary | ICD-10-CM | POA: Diagnosis not present

## 2019-06-27 DIAGNOSIS — Z125 Encounter for screening for malignant neoplasm of prostate: Secondary | ICD-10-CM | POA: Diagnosis not present

## 2019-06-27 LAB — LIPID PANEL
Cholesterol: 187 mg/dL (ref 0–200)
HDL: 37 mg/dL — ABNORMAL LOW (ref >39.00)
LDL Cholesterol: 111 mg/dL — ABNORMAL HIGH (ref 0–99)
NonHDL: 150.28
Total CHOL/HDL Ratio: 5
Triglycerides: 195 mg/dL — ABNORMAL HIGH (ref 0.0–149.0)
VLDL: 39 mg/dL (ref 0.0–40.0)

## 2019-06-27 LAB — CBC WITH DIFFERENTIAL/PLATELET
Basophils Absolute: 0 K/uL (ref 0.0–0.1)
Basophils Relative: 0.8 % (ref 0.0–3.0)
Eosinophils Absolute: 0.1 K/uL (ref 0.0–0.7)
Eosinophils Relative: 1.6 % (ref 0.0–5.0)
HCT: 43.6 % (ref 39.0–52.0)
Hemoglobin: 15.2 g/dL (ref 13.0–17.0)
Lymphocytes Relative: 40.3 % (ref 12.0–46.0)
Lymphs Abs: 2.5 K/uL (ref 0.7–4.0)
MCHC: 35 g/dL (ref 30.0–36.0)
MCV: 95.2 fl (ref 78.0–100.0)
Monocytes Absolute: 0.5 K/uL (ref 0.1–1.0)
Monocytes Relative: 7.7 % (ref 3.0–12.0)
Neutro Abs: 3 K/uL (ref 1.4–7.7)
Neutrophils Relative %: 49.6 % (ref 43.0–77.0)
Platelets: 159 K/uL (ref 150.0–400.0)
RBC: 4.58 Mil/uL (ref 4.22–5.81)
RDW: 12.4 % (ref 11.5–15.5)
WBC: 6.1 K/uL (ref 4.0–10.5)

## 2019-06-27 LAB — URINALYSIS, ROUTINE W REFLEX MICROSCOPIC
Bilirubin Urine: NEGATIVE
Hgb urine dipstick: NEGATIVE
Ketones, ur: NEGATIVE
Leukocytes,Ua: NEGATIVE
Nitrite: NEGATIVE
RBC / HPF: NONE SEEN (ref 0–?)
Specific Gravity, Urine: 1.02 (ref 1.000–1.030)
Total Protein, Urine: NEGATIVE
Urine Glucose: NEGATIVE
Urobilinogen, UA: 0.2 (ref 0.0–1.0)
WBC, UA: NONE SEEN (ref 0–?)
pH: 5.5 (ref 5.0–8.0)

## 2019-06-27 LAB — HEPATIC FUNCTION PANEL
ALT: 33 U/L (ref 0–53)
AST: 36 U/L (ref 0–37)
Albumin: 4.2 g/dL (ref 3.5–5.2)
Alkaline Phosphatase: 66 U/L (ref 39–117)
Bilirubin, Direct: 0.1 mg/dL (ref 0.0–0.3)
Total Bilirubin: 0.5 mg/dL (ref 0.2–1.2)
Total Protein: 7 g/dL (ref 6.0–8.3)

## 2019-06-27 LAB — PSA: PSA: 0.93 ng/mL (ref 0.10–4.00)

## 2019-06-27 LAB — TSH: TSH: 1.2 u[IU]/mL (ref 0.35–4.50)

## 2019-06-27 LAB — BASIC METABOLIC PANEL WITH GFR
BUN: 14 mg/dL (ref 6–23)
CO2: 27 meq/L (ref 19–32)
Calcium: 9 mg/dL (ref 8.4–10.5)
Chloride: 102 meq/L (ref 96–112)
Creatinine, Ser: 0.95 mg/dL (ref 0.40–1.50)
GFR: 83.07 mL/min
Glucose, Bld: 84 mg/dL (ref 70–99)
Potassium: 3.8 meq/L (ref 3.5–5.1)
Sodium: 135 meq/L (ref 135–145)

## 2019-07-05 ENCOUNTER — Encounter: Payer: Self-pay | Admitting: Internal Medicine

## 2019-07-05 ENCOUNTER — Ambulatory Visit (INDEPENDENT_AMBULATORY_CARE_PROVIDER_SITE_OTHER): Payer: BC Managed Care – PPO | Admitting: Internal Medicine

## 2019-07-05 ENCOUNTER — Other Ambulatory Visit: Payer: Self-pay

## 2019-07-05 VITALS — BP 148/94 | HR 64 | Temp 99.0°F | Ht 71.0 in | Wt 178.6 lb

## 2019-07-05 DIAGNOSIS — R030 Elevated blood-pressure reading, without diagnosis of hypertension: Secondary | ICD-10-CM | POA: Diagnosis not present

## 2019-07-05 DIAGNOSIS — Z114 Encounter for screening for human immunodeficiency virus [HIV]: Secondary | ICD-10-CM | POA: Diagnosis not present

## 2019-07-05 DIAGNOSIS — E559 Vitamin D deficiency, unspecified: Secondary | ICD-10-CM | POA: Diagnosis not present

## 2019-07-05 DIAGNOSIS — J309 Allergic rhinitis, unspecified: Secondary | ICD-10-CM | POA: Diagnosis not present

## 2019-07-05 DIAGNOSIS — R42 Dizziness and giddiness: Secondary | ICD-10-CM | POA: Diagnosis not present

## 2019-07-05 DIAGNOSIS — Z0001 Encounter for general adult medical examination with abnormal findings: Secondary | ICD-10-CM

## 2019-07-05 DIAGNOSIS — E538 Deficiency of other specified B group vitamins: Secondary | ICD-10-CM

## 2019-07-05 NOTE — Patient Instructions (Signed)

## 2019-07-05 NOTE — Assessment & Plan Note (Signed)
Mild to mod, for add nasacort asd,,  to f/u any worsening symptoms or concerns 

## 2019-07-05 NOTE — Assessment & Plan Note (Signed)

## 2019-07-05 NOTE — Assessment & Plan Note (Signed)
Has gained significant, wt, declines med tx, for f/u bp at home and next visit

## 2019-07-05 NOTE — Assessment & Plan Note (Addendum)
For meclizine prn, consider vestibular rehab or mrI  I spent 31 minutes in preparing to see the patient by review of recent labs, imaging and procedures, obtaining and reviewing separately obtained history, communicating with the patient and family or caregiver, ordering medications, tests or procedures, and documenting clinical information in the EHR including the differential Dx, treatment, and any further evaluation and other management of vertigo, allergies, and elevate dbp

## 2019-07-05 NOTE — Progress Notes (Signed)
Subjective:    Patient ID: Todd Sims, male    DOB: 17-Dec-1965, 54 y.o.   MRN: [id number]  HPI  Here for wellness and f/u;  Overall doing ok;  Pt denies Chest pain, worsening SOB, DOE, wheezing, orthopnea, PND, worsening LE edema, palpitations, dizziness or syncope.  Pt denies neurological change such as new headache, facial or extremity weakness.  Pt denies polydipsia, polyuria, or low sugar symptoms. Pt states overall good compliance with treatment and medications, good tolerability, and has been trying to follow appropriate diet.  Pt denies worsening depressive symptoms, suicidal ideation or panic. No fever, night sweats, wt loss, loss of appetite, or other constitutional symptoms.  Pt states good ability with ADL's, has low fall risk, home safety reviewed and adequate, no other significant changes in hearing or vision, and only occasionally active with exercise Had Nov 2020 covid infxn, now still more doe than usual.  Has gained wt with pandemic.   BP Readings from Last 3 Encounters:  07/05/19 (!) 148/94  04/20/18 122/78  09/07/17 120/75   Wt Readings from Last 3 Encounters:  07/05/19 178 lb 9.6 oz (81 kg)  04/20/18 171 lb (77.6 kg)  09/07/17 161 lb (73 kg)  has had intermittent vertigo with sinus allergy complaints for several wks.  Does have several wks ongoing nasal allergy symptoms with clearish congestion, itch and sneezing, without fever, pain, ST, cough, swelling or wheezing. Marland Kitchenpcjx Current Outpatient Medications on File Prior to Visit  Medication Sig Dispense Refill  . sertraline (ZOLOFT) 100 MG tablet Take 1 tablet (100 mg total) by mouth daily. 90 tablet 3  . clonazePAM (KLONOPIN) 0.5 MG tablet Take 1 tablet (0.5 mg total) by mouth 2 (two) times daily as needed for anxiety. (Patient not taking: Reported on 07/05/2019) 30 tablet 1  . dicyclomine (BENTYL) 20 MG tablet 20 mg by mouth twice daily,before breakfast and evening meal (Patient not taking: Reported on 07/05/2019) 60  tablet 3  . sildenafil (VIAGRA) 100 MG tablet Take 0.5-1 tablets (50-100 mg total) by mouth daily as needed for erectile dysfunction. (Patient not taking: Reported on 07/05/2019) 10 tablet 11   No current facility-administered medications on file prior to visit.   Review of Systems All otherwise neg per pt     Objective:   Physical Exam BP (!) 148/94   Pulse 64   Temp 99 F (37.2 C)   Ht 5\' 11"  (1.803 m)   Wt 178 lb 9.6 oz (81 kg)   SpO2 99%   BMI 24.91 kg/m  VS noted,  Constitutional: Pt appears in NAD HENT: Head: NCAT.  Right Ear: External ear normal.  Left Ear: External ear normal.  Eyes: . Pupils are equal, round, and reactive to light. Conjunctivae and EOM are normal Nose: without d/c or deformity Neck: Neck supple. Gross normal ROM Cardiovascular: Normal rate and regular rhythm.   Pulmonary/Chest: Effort normal and breath sounds without rales or wheezing.  Abd:  Soft, NT, ND, + BS, no organomegaly Neurological: Pt is alert. At baseline orientation, motor grossly intact Skin: Skin is warm. No rashes, other new lesions, no LE edema Psychiatric: Pt behavior is normal without agitation  All otherwise neg per pt Lab Results  Component Value Date   WBC 6.1 06/27/2019   HGB 15.2 06/27/2019   HCT 43.6 06/27/2019   PLT 159.0 06/27/2019   GLUCOSE 84 06/27/2019   CHOL 187 06/27/2019   TRIG 195.0 (H) 06/27/2019   HDL 37.00 (L) 06/27/2019   LDLDIRECT  137.4 08/04/2011   LDLCALC 111 (H) 06/27/2019   ALT 33 06/27/2019   AST 36 06/27/2019   NA 135 06/27/2019   K 3.8 06/27/2019   CL 102 06/27/2019   CREATININE 0.95 06/27/2019   BUN 14 06/27/2019   CO2 27 06/27/2019   TSH 1.20 06/27/2019   PSA 0.93 06/27/2019       Assessment & Plan:

## 2019-07-28 DIAGNOSIS — Y9229 Other specified public building as the place of occurrence of the external cause: Secondary | ICD-10-CM | POA: Diagnosis not present

## 2019-07-28 DIAGNOSIS — R55 Syncope and collapse: Secondary | ICD-10-CM | POA: Diagnosis not present

## 2019-07-28 DIAGNOSIS — S0990XA Unspecified injury of head, initial encounter: Secondary | ICD-10-CM | POA: Diagnosis not present

## 2019-07-28 DIAGNOSIS — R519 Headache, unspecified: Secondary | ICD-10-CM | POA: Diagnosis not present

## 2019-07-28 DIAGNOSIS — R609 Edema, unspecified: Secondary | ICD-10-CM | POA: Diagnosis not present

## 2019-07-28 DIAGNOSIS — K1379 Other lesions of oral mucosa: Secondary | ICD-10-CM | POA: Diagnosis not present

## 2019-07-28 DIAGNOSIS — K130 Diseases of lips: Secondary | ICD-10-CM | POA: Diagnosis not present

## 2019-07-28 DIAGNOSIS — W19XXXA Unspecified fall, initial encounter: Secondary | ICD-10-CM | POA: Diagnosis not present

## 2019-07-28 DIAGNOSIS — S00531A Contusion of lip, initial encounter: Secondary | ICD-10-CM | POA: Diagnosis not present

## 2019-07-28 DIAGNOSIS — S01511A Laceration without foreign body of lip, initial encounter: Secondary | ICD-10-CM | POA: Diagnosis not present

## 2019-08-13 ENCOUNTER — Other Ambulatory Visit: Payer: Self-pay | Admitting: Internal Medicine

## 2019-10-27 ENCOUNTER — Emergency Department (HOSPITAL_COMMUNITY): Payer: BC Managed Care – PPO

## 2019-10-27 ENCOUNTER — Emergency Department (HOSPITAL_COMMUNITY)
Admission: EM | Admit: 2019-10-27 | Discharge: 2019-10-27 | Disposition: A | Discharge status: Home / Self Care | Payer: BC Managed Care – PPO | Attending: Emergency Medicine | Admitting: Emergency Medicine

## 2019-10-27 ENCOUNTER — Encounter (HOSPITAL_COMMUNITY): Payer: Self-pay | Admitting: Emergency Medicine

## 2019-10-27 DIAGNOSIS — G454 Transient global amnesia: Secondary | ICD-10-CM

## 2019-10-27 DIAGNOSIS — J341 Cyst and mucocele of nose and nasal sinus: Secondary | ICD-10-CM | POA: Diagnosis not present

## 2019-10-27 DIAGNOSIS — R569 Unspecified convulsions: Secondary | ICD-10-CM | POA: Diagnosis not present

## 2019-10-27 DIAGNOSIS — I7090 Unspecified atherosclerosis: Secondary | ICD-10-CM | POA: Diagnosis not present

## 2019-10-27 DIAGNOSIS — Z7982 Long term (current) use of aspirin: Secondary | ICD-10-CM | POA: Diagnosis not present

## 2019-10-27 DIAGNOSIS — R4781 Slurred speech: Secondary | ICD-10-CM | POA: Diagnosis not present

## 2019-10-27 DIAGNOSIS — J320 Chronic maxillary sinusitis: Secondary | ICD-10-CM | POA: Diagnosis not present

## 2019-10-27 DIAGNOSIS — R29810 Facial weakness: Secondary | ICD-10-CM | POA: Insufficient documentation

## 2019-10-27 DIAGNOSIS — Z79899 Other long term (current) drug therapy: Secondary | ICD-10-CM | POA: Diagnosis not present

## 2019-10-27 DIAGNOSIS — R001 Bradycardia, unspecified: Secondary | ICD-10-CM | POA: Diagnosis not present

## 2019-10-27 DIAGNOSIS — G9340 Encephalopathy, unspecified: Secondary | ICD-10-CM | POA: Diagnosis not present

## 2019-10-27 DIAGNOSIS — J322 Chronic ethmoidal sinusitis: Secondary | ICD-10-CM | POA: Diagnosis not present

## 2019-10-27 DIAGNOSIS — J3489 Other specified disorders of nose and nasal sinuses: Secondary | ICD-10-CM | POA: Diagnosis not present

## 2019-10-27 DIAGNOSIS — R410 Disorientation, unspecified: Secondary | ICD-10-CM | POA: Diagnosis not present

## 2019-10-27 DIAGNOSIS — R4182 Altered mental status, unspecified: Secondary | ICD-10-CM | POA: Diagnosis not present

## 2019-10-27 LAB — PROTIME-INR
INR: 1 (ref 0.8–1.2)
Prothrombin Time: 12.9 seconds (ref 11.4–15.2)

## 2019-10-27 LAB — URINALYSIS, ROUTINE W REFLEX MICROSCOPIC
Bilirubin Urine: NEGATIVE
Glucose, UA: NEGATIVE mg/dL
Hgb urine dipstick: NEGATIVE
Ketones, ur: NEGATIVE mg/dL
Leukocytes,Ua: NEGATIVE
Nitrite: NEGATIVE
Protein, ur: NEGATIVE mg/dL
Specific Gravity, Urine: 1.004 — ABNORMAL LOW (ref 1.005–1.030)
pH: 6 (ref 5.0–8.0)

## 2019-10-27 LAB — CBC
HCT: 44.6 % (ref 39.0–52.0)
Hemoglobin: 15 g/dL (ref 13.0–17.0)
MCH: 32.3 pg (ref 26.0–34.0)
MCHC: 33.6 g/dL (ref 30.0–36.0)
MCV: 96.1 fL (ref 80.0–100.0)
Platelets: 149 10*3/uL — ABNORMAL LOW (ref 150–400)
RBC: 4.64 MIL/uL (ref 4.22–5.81)
RDW: 12.3 % (ref 11.5–15.5)
WBC: 4.8 10*3/uL (ref 4.0–10.5)
nRBC: 0 % (ref 0.0–0.2)

## 2019-10-27 LAB — COMPREHENSIVE METABOLIC PANEL
ALT: 55 U/L — ABNORMAL HIGH (ref 0–44)
AST: 59 U/L — ABNORMAL HIGH (ref 15–41)
Albumin: 4.1 g/dL (ref 3.5–5.0)
Alkaline Phosphatase: 51 U/L (ref 38–126)
Anion gap: 9 (ref 5–15)
BUN: 17 mg/dL (ref 6–20)
CO2: 22 mmol/L (ref 22–32)
Calcium: 9 mg/dL (ref 8.9–10.3)
Chloride: 106 mmol/L (ref 98–111)
Creatinine, Ser: 1.07 mg/dL (ref 0.61–1.24)
GFR calc Af Amer: >60 mL/min (ref >60)
GFR calc non Af Amer: >60 mL/min (ref >60)
Glucose, Bld: 98 mg/dL (ref 70–99)
Potassium: 4.2 mmol/L (ref 3.5–5.1)
Sodium: 137 mmol/L (ref 135–145)
Total Bilirubin: 1 mg/dL (ref 0.3–1.2)
Total Protein: 6.8 g/dL (ref 6.5–8.1)

## 2019-10-27 LAB — DIFFERENTIAL
Abs Immature Granulocytes: 0.01 10*3/uL (ref 0.00–0.07)
Basophils Absolute: 0.1 10*3/uL (ref 0.0–0.1)
Basophils Relative: 1 %
Eosinophils Absolute: 0.1 10*3/uL (ref 0.0–0.5)
Eosinophils Relative: 2 %
Immature Granulocytes: 0 %
Lymphocytes Relative: 33 %
Lymphs Abs: 1.6 10*3/uL (ref 0.7–4.0)
Monocytes Absolute: 0.4 10*3/uL (ref 0.1–1.0)
Monocytes Relative: 9 %
Neutro Abs: 2.7 10*3/uL (ref 1.7–7.7)
Neutrophils Relative %: 55 %

## 2019-10-27 LAB — I-STAT CHEM 8, ED
BUN: 20 mg/dL (ref 6–20)
Calcium, Ion: 1.14 mmol/L — ABNORMAL LOW (ref 1.15–1.40)
Chloride: 104 mmol/L (ref 98–111)
Creatinine, Ser: 1 mg/dL (ref 0.61–1.24)
Glucose, Bld: 96 mg/dL (ref 70–99)
HCT: 42 % (ref 39.0–52.0)
Hemoglobin: 14.3 g/dL (ref 13.0–17.0)
Potassium: 4.3 mmol/L (ref 3.5–5.1)
Sodium: 138 mmol/L (ref 135–145)
TCO2: 26 mmol/L (ref 22–32)

## 2019-10-27 LAB — APTT: aPTT: 33 seconds (ref 24–36)

## 2019-10-27 LAB — RAPID URINE DRUG SCREEN, HOSP PERFORMED
Amphetamines: NOT DETECTED
Barbiturates: NOT DETECTED
Benzodiazepines: NOT DETECTED
Cocaine: NOT DETECTED
Opiates: NOT DETECTED
Tetrahydrocannabinol: NOT DETECTED

## 2019-10-27 LAB — ETHANOL: Alcohol, Ethyl (B): <10 mg/dL (ref <10)

## 2019-10-27 LAB — CBG MONITORING, ED: Glucose-Capillary: 93 mg/dL (ref 70–99)

## 2019-10-27 MED ORDER — ASPIRIN 81 MG PO CHEW
81.0000 mg | CHEWABLE_TABLET | Freq: Every day | ORAL | 2 refills | Status: DC
Start: 2019-10-27 — End: 2020-03-13

## 2019-10-27 MED ORDER — IOHEXOL 350 MG/ML SOLN
75.0000 mL | Freq: Once | INTRAVENOUS | Status: AC | PRN
  Administered 2019-10-27: 75 mL via INTRAVENOUS

## 2019-10-27 MED ORDER — SODIUM CHLORIDE 0.9% FLUSH
3.0000 mL | Freq: Once | INTRAVENOUS | Status: DC
Start: 2019-10-27 — End: 2019-10-27

## 2019-10-27 NOTE — ED Provider Notes (Addendum)
[facility] EMER[facility]ENCY DEPARTMENT Provider Note   CSN: [id number] Arrival date & time: 10/27/19  6294     History Chief Complaint  Patient presents with   Stroke Symptoms    Todd Sims is a 54 y.o. male who presents to the ED accompanied by his wife for acute onset confusion and memory disturbance.  His wife believes that he has right-sided facial drooping compared to his baseline.  She spoke with him on the phone at 7:30 AM and he was at baseline and conversing normally.  However, patient reports that he does not really remember much of what transpired after speaking with his wife.  He believes he laid down the couch and "might of passed out".  He was having dreams about coming to the hospital for stroke work-up.  Evidently his daughter and neighbor who saw him noted that he was confused and had mildly slurred speech.  He repeatedly notes a family history of myocardial infarction and will then asked "did I already tell you that?"  He was able to ambulate from his vehicle to the ER before subsequently being placed in a wheelchair, without significant impairment.  Patient also feels as though he has mildly blurred vision in his left eye as well as a dull headache.  He denies any numbness or weakness, recent infection, chest pain or shortness of breath, palpitations, abdominal discomfort, tongue biting, incontinence, nausea or vomiting, diaphoresis, or other symptoms.    HPI     Past Medical History:  Diagnosis Date   Allergic rhinitis    ALLERGIC RHINITIS 08/26/2007   Allergy    Anxiety 08/06/2010   Cervical neck pain with evidence of disc disease 08/06/2010   Chronic prostatitis 08/07/2011   Degenerative disk disease    lumbar and C- spine   Depression    GERD 04/18/2010   GERD (gastroesophageal reflux disease)    Hyperlipemia    HYPERLIPIDEMIA 08/26/2007   Neck pain    Premature ejaculation 08/06/2010   Shoulder pain, right    Thoracic outlet  syndrome 08/07/2011    Patient Active Problem List   Diagnosis Date Noted   Vertigo 07/05/2019   Blood pressure elevated without history of HTN 07/05/2019   Erectile dysfunction 05/26/2017   Syncope 01/01/2016   Finger fracture, right 07/10/2014   Trigger finger, acquired 06/20/2014   Right hand pain 06/12/2014   Thoracic outlet syndrome 08/07/2011   Chronic prostatitis 08/07/2011   Decreased libido 08/07/2011   Cervical neck pain with evidence of disc disease 08/06/2010   Anxiety 08/06/2010   Premature ejaculation 08/06/2010   Encounter for well adult exam with abnormal findings 08/03/2010   GERD 04/18/2010   Hyperlipidemia 08/26/2007   Allergic rhinitis 08/26/2007    Past Surgical History:  Procedure Laterality Date   THORACIC OUTLET SURGERY Right 2014       Family History  Problem Relation Age of Onset   Lung cancer Mother    Colon polyps Father    Colon cancer Neg Hx    Esophageal cancer Neg Hx    Liver cancer Neg Hx    Pancreatic cancer Neg Hx    Rectal cancer Neg Hx    Stomach cancer Neg Hx     Social History   Tobacco Use   Smoking status: Never Smoker   Smokeless tobacco: Never Used  Vaping Use   Vaping Use: Never used  Substance Use Topics   Alcohol use: Yes    Alcohol/week: 2.0 standard drinks  Types: 2 Cans of beer per week   Drug use: No    Home Medications Prior to Admission medications   Medication Sig Start Date End Date Taking? Authorizing Provider  sertraline (ZOLOFT) 100 MG tablet TAKE 1 TABLET DAILY Patient taking differently: Take 100 mg by mouth daily.  08/13/19  Yes Biagio Borg, MD  aspirin 81 MG chewable tablet Chew 1 tablet (81 mg total) by mouth daily. 10/27/19   Corena Herter, PA-C  clonazePAM (KLONOPIN) 0.5 MG tablet Take 1 tablet (0.5 mg total) by mouth 2 (two) times daily as needed for anxiety. Patient not taking: Reported on 07/05/2019 06/07/17   Biagio Borg, MD  dicyclomine (BENTYL) 20 MG  tablet 20 mg by mouth twice daily,before breakfast and evening meal Patient not taking: Reported on 07/05/2019 09/07/17   Ladene Artist, MD  sildenafil (VIAGRA) 100 MG tablet Take 0.5-1 tablets (50-100 mg total) by mouth daily as needed for erectile dysfunction. Patient not taking: Reported on 07/05/2019 07/06/17   Biagio Borg, MD    Allergies    Patient has no known allergies.  Review of Systems   Review of Systems  All other systems reviewed and are negative.   Physical Exam Updated Vital Signs BP (!) 150/96    Pulse (!) 59    Temp 97.8 F (36.6 C)    Resp 18    SpO2 99%   Physical Exam Vitals and nursing note reviewed. Exam conducted with a chaperone present.  Constitutional:      Appearance: Normal appearance.  HENT:     Head: Normocephalic and atraumatic.  Eyes:     General: No scleral icterus.    Conjunctiva/sclera: Conjunctivae normal.  Cardiovascular:     Rate and Rhythm: Normal rate and regular rhythm.     Pulses: Normal pulses.     Heart sounds: Normal heart sounds.  Pulmonary:     Effort: Pulmonary effort is normal. No respiratory distress.     Breath sounds: Normal breath sounds.  Abdominal:     General: Abdomen is flat. There is no distension.     Palpations: Abdomen is soft.     Tenderness: There is no abdominal tenderness.  Skin:    General: Skin is dry.  Neurological:     Mental Status: He is alert.     GCS: GCS eye subscore is 4. GCS verbal subscore is 5. GCS motor subscore is 6.     Comments: CN II through XII grossly intact.  Mild right-sided smile asymmetry noted by wife as well as mild right eye ptosis.  Difficulty appreciating sharp versus dull on right side of face.  Strength and ROM intact throughout.  Sensation intact throughout.  Strained memory.  Normal Romberg and cerebellar exam.  Psychiatric:        Mood and Affect: Mood normal.        Thought Content: Thought content normal.     ED Results / Procedures / Treatments   Labs (all labs  ordered are listed, but only abnormal results are displayed) Labs Reviewed  CBC - Abnormal; Notable for the following components:      Result Value   Platelets 149 (*)    All other components within normal limits  COMPREHENSIVE METABOLIC PANEL - Abnormal; Notable for the following components:   AST 59 (*)    ALT 55 (*)    All other components within normal limits  URINALYSIS, ROUTINE W REFLEX MICROSCOPIC - Abnormal; Notable for the following  components:   Color, Urine STRAW (*)    Specific Gravity, Urine 1.004 (*)    All other components within normal limits  I-STAT CHEM 8, ED - Abnormal; Notable for the following components:   Calcium, Ion 1.14 (*)    All other components within normal limits  PROTIME-INR  APTT  DIFFERENTIAL  ETHANOL  RAPID URINE DRUG SCREEN, HOSP PERFORMED  CBG MONITORING, ED  CBG MONITORING, ED    EKG EKG Interpretation  Date/Time:  Friday October 27 2019 09:20:10 EDT Ventricular Rate:  58 PR Interval:  148 QRS Duration: 90 QT Interval:  420 QTC Calculation: 412 R Axis:   73 Text Interpretation: Sinus bradycardia Otherwise normal ECG No old tracing to compare Confirmed by Noemi Chapel [phone]) on 10/27/2019 9:28:28 AM   Radiology EEG  Result Date: 10/27/2019 Lora Havens, MD     10/27/2019  1:32 PM Patient Name: Todd Sims MRN: [id number] Epilepsy Attending: Lora Havens Referring Physician/Provider: Etta Quill, PA Date: 10/27/2019 Duration: 24.24 mins Patient history: 54yo M with transient global amnesia. EEG to evaluate for seizure. Level of alertness: Awake, drowsy AEDs during EEG study: None Technical aspects: This EEG study was done with scalp electrodes positioned according to the 10-20 International system of electrode placement. Electrical activity was acquired at a sampling rate of 500Hz  and reviewed with a high frequency filter of 70Hz  and a low frequency filter of 1Hz . EEG data were recorded continuously and digitally stored.  Description: The posterior dominant rhythm consists of 10 Hz activity of moderate voltage (25-35 uV) seen predominantly in posterior head regions, symmetric and reactive to eye opening and eye closing. Drowsiness was characterized by attenuation of the posterior background rhythm. Sleep was characterized by vertex waves, sleep spindles (12 to 14 Hz), maximal frontocentral region.  Hyperventilation did not show any EEG change.  Physiology photic driving was seen during photic stimulation.  IMPRESSION: This study is within normal limits. No seizures or epileptiform discharges were seen throughout the recording. Priyanka Barbra Sarks   CT HEAD WO CONTRAST  Result Date: 10/27/2019 CLINICAL DATA:  Altered mental status, unclear cause. Possible stroke. EXAM: CT HEAD WITHOUT CONTRAST TECHNIQUE: Contiguous axial images were obtained from the base of the skull through the vertex without intravenous contrast. COMPARISON:  No pertinent prior studies available for comparison. FINDINGS: Brain: Cerebral volume is normal. Absence of the septum pellucidum. There is no acute intracranial hemorrhage. No demarcated cortical infarct. No extra-axial fluid collection. No evidence of intracranial mass. No midline shift. Vascular: No definite hyperdense vessel. Atherosclerotic calcifications. Skull: Normal. Negative for fracture or focal lesion. Sinuses/Orbits: Visualized orbits show no acute finding. Mild ethmoid and left maxillary sinus mucosal thickening at the imaged levels. No significant mastoid effusion at the imaged levels IMPRESSION: No CT evidence of acute intracranial abnormality. Incidentally noted absence of the septum pellucidum. Mild ethmoid and left maxillary sinus mucosal thickening Electronically Signed   By: Kellie Simmering DO   On: 10/27/2019 10:45   MR BRAIN WO CONTRAST  Result Date: 10/27/2019 CLINICAL DATA:  Encephalopathy. Acute onset confusion and slurred speech. EXAM: MRI HEAD WITHOUT CONTRAST TECHNIQUE:  Multiplanar, multiecho pulse sequences of the brain and surrounding structures were obtained without intravenous contrast. COMPARISON:  Head CT 10/27/2019 FINDINGS: Brain: There is no evidence of acute infarct, intracranial hemorrhage, mass, midline shift, or extra-axial fluid collection. Cerebral volume is within normal limits for age. Absence of the septum pellucidum is again noted. The brain is normal in signal. Vascular: Major intracranial  vascular flow voids are preserved. Skull and upper cervical spine: Unremarkable bone marrow signal. Sinuses/Orbits: Mucous retention cysts in the maxillary sinuses. Trace bilateral mastoid fluid. Unremarkable orbits. Other: None. IMPRESSION: 1. No acute intracranial abnormality. 2. Absent septum pellucidum, otherwise unremarkable appearance of the brain. Electronically Signed   By: Logan Bores M.D.   On: 10/27/2019 12:04   CT ANGIO HEAD CODE STROKE  Result Date: 10/27/2019 CLINICAL DATA:  Code stroke.  Altered mental status. EXAM: CT ANGIOGRAPHY HEAD AND NECK TECHNIQUE: Multidetector CT imaging of the head and neck was performed using the standard protocol during bolus administration of intravenous contrast. Multiplanar CT image reconstructions and MIPs were obtained to evaluate the vascular anatomy. Carotid stenosis measurements (when applicable) are obtained utilizing NASCET criteria, using the distal internal carotid diameter as the denominator. CONTRAST:  37mL OMNIPAQUE IOHEXOL 350 MG/ML SOLN COMPARISON:  None. FINDINGS: CTA NECK FINDINGS Aortic arch: Standard 3 vessel aortic arch with widely patent arch vessel origins. Right carotid system: Patent and smooth without evidence of stenosis or dissection. Left carotid system: Patent and smooth without evidence of stenosis or dissection. Vertebral arteries: Patent and smooth without evidence of stenosis or dissection. Moderately to strongly dominant left vertebral artery. Skeleton: No acute osseous abnormality or  suspicious osseous lesion. Other neck: No evidence of cervical lymphadenopathy or mass. Upper chest: Clear lung apices. Review of the MIP images confirms the above findings CTA HEAD FINDINGS Anterior circulation: The internal carotid arteries are patent from skull base to carotid termini with mild atherosclerotic plaque on the left not resulting in significant stenosis. ACAs and MCAs are patent without evidence of a proximal branch occlusion or significant proximal stenosis. No aneurysm is identified. Posterior circulation: The intracranial vertebral arteries are widely patent to the basilar. A patent left PICA and bilateral SCAs are present. AICAs and a right PICA are not clearly identified. The basilar artery is widely patent. Posterior communicating arteries are diminutive or absent. Both PCAs are patent without evidence of a significant proximal stenosis. No aneurysm is identified. Venous sinuses: As permitted by contrast timing, patent. Anatomic variants: Hypoplastic right A1. Review of the MIP images confirms the above findings IMPRESSION: 1. Minimal intracranial atherosclerosis without large vessel occlusion or significant proximal stenosis. 2. Widely patent cervical carotid and vertebral arteries. Electronically Signed   By: Logan Bores M.D.   On: 10/27/2019 11:25   CT ANGIO NECK CODE STROKE  Result Date: 10/27/2019 CLINICAL DATA:  Code stroke.  Altered mental status. EXAM: CT ANGIOGRAPHY HEAD AND NECK TECHNIQUE: Multidetector CT imaging of the head and neck was performed using the standard protocol during bolus administration of intravenous contrast. Multiplanar CT image reconstructions and MIPs were obtained to evaluate the vascular anatomy. Carotid stenosis measurements (when applicable) are obtained utilizing NASCET criteria, using the distal internal carotid diameter as the denominator. CONTRAST:  57mL OMNIPAQUE IOHEXOL 350 MG/ML SOLN COMPARISON:  None. FINDINGS: CTA NECK FINDINGS Aortic arch:  Standard 3 vessel aortic arch with widely patent arch vessel origins. Right carotid system: Patent and smooth without evidence of stenosis or dissection. Left carotid system: Patent and smooth without evidence of stenosis or dissection. Vertebral arteries: Patent and smooth without evidence of stenosis or dissection. Moderately to strongly dominant left vertebral artery. Skeleton: No acute osseous abnormality or suspicious osseous lesion. Other neck: No evidence of cervical lymphadenopathy or mass. Upper chest: Clear lung apices. Review of the MIP images confirms the above findings CTA HEAD FINDINGS Anterior circulation: The internal carotid arteries are patent from  skull base to carotid termini with mild atherosclerotic plaque on the left not resulting in significant stenosis. ACAs and MCAs are patent without evidence of a proximal branch occlusion or significant proximal stenosis. No aneurysm is identified. Posterior circulation: The intracranial vertebral arteries are widely patent to the basilar. A patent left PICA and bilateral SCAs are present. AICAs and a right PICA are not clearly identified. The basilar artery is widely patent. Posterior communicating arteries are diminutive or absent. Both PCAs are patent without evidence of a significant proximal stenosis. No aneurysm is identified. Venous sinuses: As permitted by contrast timing, patent. Anatomic variants: Hypoplastic right A1. Review of the MIP images confirms the above findings IMPRESSION: 1. Minimal intracranial atherosclerosis without large vessel occlusion or significant proximal stenosis. 2. Widely patent cervical carotid and vertebral arteries. Electronically Signed   By: Logan Bores M.D.   On: 10/27/2019 11:25    Procedures Procedures (including critical care time)  Medications Ordered in ED Medications  sodium chloride flush (NS) 0.9 % injection 3 mL (has no administration in time range)  iohexol (OMNIPAQUE) 350 MG/ML injection 75 mL  (75 mLs Intravenous Contrast Given 10/27/19 1109)    ED Course  I have reviewed the triage vital signs and the nursing notes.  Pertinent labs & imaging results that were available during my care of the patient were reviewed by me and considered in my medical decision making (see chart for details).    MDM Rules/Calculators/A&P                          We will initiate code stroke given patient's acute mental status change at approximately 8:15 AM this morning.  Last known normal was conversation with wife at 7:30 AM.  Patient is confused about what transpired after the phone call and states that he may have passed out on the couch.  Patient is struggling to recall what has happened since then as well as what he had done last evening.  His wife Mickel Baas, at bedside, reports that this is a gross deviation from his baseline.  There is mild right-sided facial droop and ptosis noted on exam and he is endorsing mild left-sided blurred vision.  He also has been repetitive with certain pieces of information, however now obvious dysarthria.  CN II through XII grossly intact with exception of mild facial asymmetry.  Negative Romberg and cerebellar exams.  No witnessed seizure activity or history of seizures.  No tongue biting or incontinence.  Initial CBG WNL.  I reviewed patient's medical record and patient was evaluated for syncope 01/01/2016 in which she had reported 6 episodes of dizziness spells that occurred over a 3-weeks, one of which led to a short duration LOC.  However, patient states that today's presentation is entirely different.   Patient's laboratory work-up was personally reviewed and largely unremarkable.  Code stroke was initiated given his acute altered mental status and right-sided facial droop.  CT, CT angio head and neck, and MR brain was obtained and all unremarkable.  No acute findings concerning for CVA.  Patient was evaluated by neurology, Dr. Rory Percy, who will obtain EEG and if negative,  patient is safe for discharge with likely diagnosis of TGA.  If concerning findings, will re-consult.    EEG is personally reviewed and found to be within normal limits.  No seizures or epileptiform discharges were seen throughout the recording.  Discussed findings with patient who is safe for discharge.  Given his  transient global amnesia, encouraging patient to avoid his car race event this weekend and to rest.  Per recommendation of Dr. Rory Percy, will start on 81 mg aspirin daily.  Will also provide information for outpatient neurology.  He will need to follow-up with his primary care provider regarding today's encounter.  All of the evaluation and work-up results were discussed with the patient and any family at bedside.  Patient and/or family were informed that while patient is appropriate for discharge at this time, some medical emergencies may only develop or become detectable after a period of time.  I specifically instructed patient and/or family to return to return to the ED or seek immediate medical attention for any new or worsening symptoms.  They were provided opportunity to ask any additional questions and have none at this time.  Prior to discharge patient is feeling well, agreeable with plan for discharge home.  They have expressed understanding of verbal discharge instructions as well as return precautions and are agreeable to the plan.    Final Clinical Impression(s) / ED Diagnoses Final diagnoses:  Transient global amnesia    Rx / DC Orders ED Discharge Orders         Ordered    aspirin 81 MG chewable tablet  Daily     Discontinue  Reprint     10/27/19 1408           Corena Herter, PA-C 10/27/19 1407    Corena Herter, PA-C 10/27/19 1411    Noemi Chapel, MD 10/29/19 0700

## 2019-10-27 NOTE — ED Provider Notes (Signed)
This patient is a 54 year old male, drinks occasional alcohol, takes sertraline but otherwise healthy.  Presents this morning after being totally normal at 730, was seen by family around 49 calling out for his daughter, there was times of repetitive questioning and now his wife is at the bedside and states that there is a subtle right facial droop which I can appreciate on my exam as well.  He has normal heel shin, normal finger-nose-finger, normal strength in all 4 extremities, straight leg raise, grips and cranial nerves III through XII are normal except for the subtle right facial droop.  He does have some issues with some repetitive answers, he has some lack of memory to the events of this morning, there is no obvious witnessed seizure activity.  Cardiac and pulmonary exams unremarkable.  EKG unremarkable.  CT scans have been performed and a code stroke was activated.  Last the normal 730 according to the spouse.  Medical screening examination/treatment/procedure(s) were conducted as a shared visit with non-physician practitioner(s) and myself.  I personally evaluated the patient during the encounter.  Clinical Impression:   Final diagnoses:  Transient global amnesia         Todd Chapel, MD 10/29/19 0700

## 2019-10-27 NOTE — ED Triage Notes (Signed)
Pts wife states that per her daughter, pt became confused a little after 815. Wife states that neighbor came over to help and states that patient had slurred speech. pts wife met him here and states upon arrival pt seemed normal. Pt states that while he was drinking coffee he became confused. Pt alert and oriented x4. Speech clear, face symmetrical, hand grips equal, no arm drift.

## 2019-10-27 NOTE — Procedures (Signed)
Patient Name: Todd Sims  MRN: [id number]  Epilepsy Attending: Lora Havens  Referring Physician/Provider: Etta Quill, PA Date: 10/27/2019  Duration: 24.24 mins  Patient history: 54yo M with transient global amnesia. EEG to evaluate for seizure.   Level of alertness: Awake, drowsy  AEDs during EEG study: None  Technical aspects: This EEG study was done with scalp electrodes positioned according to the 10-20 International system of electrode placement. Electrical activity was acquired at a sampling rate of 500Hz  and reviewed with a high frequency filter of 70Hz  and a low frequency filter of 1Hz . EEG data were recorded continuously and digitally stored.   Description: The posterior dominant rhythm consists of 10 Hz activity of moderate voltage (25-35 uV) seen predominantly in posterior head regions, symmetric and reactive to eye opening and eye closing. Drowsiness was characterized by attenuation of the posterior background rhythm. Sleep was characterized by vertex waves, sleep spindles (12 to 14 Hz), maximal frontocentral region.  Hyperventilation did not show any EEG change.  Physiology photic driving was seen during photic stimulation.    IMPRESSION: This study is within normal limits. No seizures or epileptiform discharges were seen throughout the recording.  Leeam Cedrone Barbra Sarks

## 2019-10-27 NOTE — Consult Note (Signed)
Neurology Consultation  Reason for Consult: Code stroke Referring Physician: Noemi Chapel  CC: Confusion and repetitive questioning/slurred speech  History is obtained from: Wife  HPI: Todd Sims is a 54 y.o. male with history of hyperlipidemia, depression, cervical neck pain with evidence of disc disease.  Patient woke up this morning in normal state.  He was having coffee when he was talking to his daughter and suddenly she noted that he was confused, repetitively asking the same question.  Wife states that the neighbor came over to help and noted that the patient had slurred speech.  He was brought to the emergency room secondary to slurred speech.  While in the emergency room his slurred speech had cleared however there was a noted slight right facial droop noted in addition with the repetitive questioning noted by daughter code stroke was called.  Patient continues to have a slight right facial droop in addition to feeling confused and has no recollection of what happened prior to being hospitalized.  LKW: 0815 tpa given?: no, too good to treat Premorbid modified Rankin scale (mRS): 0 NIH stroke score: 1 for facial droop   Past Medical History:  Diagnosis Date  . Allergic rhinitis   . ALLERGIC RHINITIS 08/26/2007  . Allergy   . Anxiety 08/06/2010  . Cervical neck pain with evidence of disc disease 08/06/2010  . Chronic prostatitis 08/07/2011  . Degenerative disk disease    lumbar and C- spine  . Depression   . GERD 04/18/2010  . GERD (gastroesophageal reflux disease)   . Hyperlipemia   . HYPERLIPIDEMIA 08/26/2007  . Neck pain   . Premature ejaculation 08/06/2010  . Shoulder pain, right   . Thoracic outlet syndrome 08/07/2011    Family History  Problem Relation Age of Onset  . Lung cancer Mother   . Colon polyps Father   . Colon cancer Neg Hx   . Esophageal cancer Neg Hx   . Liver cancer Neg Hx   . Pancreatic cancer Neg Hx   . Rectal cancer Neg Hx   . Stomach cancer  Neg Hx    Social History:   reports that he has never smoked. He has never used smokeless tobacco. He reports current alcohol use of about 2.0 standard drinks of alcohol per week. He reports that he does not use drugs.  Medications  Current Facility-Administered Medications:  .  sodium chloride flush (NS) 0.9 % injection 3 mL, 3 mL, Intravenous, Once, Noemi Chapel, MD  Current Outpatient Medications:  .  sertraline (ZOLOFT) 100 MG tablet, TAKE 1 TABLET DAILY (Patient taking differently: Take 100 mg by mouth daily. ), Disp: 90 tablet, Rfl: 3 .  clonazePAM (KLONOPIN) 0.5 MG tablet, Take 1 tablet (0.5 mg total) by mouth 2 (two) times daily as needed for anxiety. (Patient not taking: Reported on 07/05/2019), Disp: 30 tablet, Rfl: 1 .  dicyclomine (BENTYL) 20 MG tablet, 20 mg by mouth twice daily,before breakfast and evening meal (Patient not taking: Reported on 07/05/2019), Disp: 60 tablet, Rfl: 3 .  sildenafil (VIAGRA) 100 MG tablet, Take 0.5-1 tablets (50-100 mg total) by mouth daily as needed for erectile dysfunction. (Patient not taking: Reported on 07/05/2019), Disp: 10 tablet, Rfl: 11  ROS:   General ROS: negative for - chills, fatigue, fever, night sweats, weight gain or weight loss Psychological ROS: negative for - behavioral disorder, hallucinations, memory difficulties, mood swings or suicidal ideation Ophthalmic ROS: negative for - blurry vision, double vision, eye pain or loss of vision ENT  ROS: negative for - epistaxis, nasal discharge, oral lesions, sore throat, tinnitus or vertigo Allergy and Immunology ROS: negative for - hives or itchy/watery eyes Hematological and Lymphatic ROS: negative for - bleeding problems, bruising or swollen lymph nodes Endocrine ROS: negative for - galactorrhea, hair pattern changes, polydipsia/polyuria or temperature intolerance Respiratory ROS: negative for - cough, hemoptysis, shortness of breath or wheezing Cardiovascular ROS: negative for - chest  pain, dyspnea on exertion, edema or irregular heartbeat Gastrointestinal ROS: negative for - abdominal pain, diarrhea, hematemesis, nausea/vomiting or stool incontinence Genito-Urinary ROS: negative for - dysuria, hematuria, incontinence or urinary frequency/urgency Musculoskeletal ROS: negative for - joint swelling or muscular weakness Neurological ROS: as noted in HPI Dermatological ROS: negative for rash and skin lesion changes  Exam: Current vital signs: BP (!) 150/96   Pulse (!) 59   Temp 97.8 F (36.6 C)   Resp 18   SpO2 99%  Vital signs in last 24 hours: Temp:  [97.8 F (36.6 C)] 97.8 F (36.6 C) (07/16 0918) Pulse Rate:  [57-59] 59 (07/16 0943) Resp:  [15-18] 18 (07/16 0943) BP: (148-152)/(89-96) 150/96 (07/16 0943) SpO2:  [99 %] 99 % (07/16 0943)   Constitutional: Appears well-developed and well-nourished.  Eyes: No scleral injection HENT: No OP obstrucion Head: Normocephalic.  Cardiovascular: Normal rate and regular rhythm.  Respiratory: Effort normal, non-labored breathing GI: Soft.  No distension. There is no tenderness.  Skin: WDI  Neuro: Mental Status: Patient is awake, alert, oriented to person, place, month, year, and situation.  Speech intact for naming, repeating, comprehension with no dysarthria or aphasia.  Patient is able to give a good coherent history.  Patient is able to follow all commands.  Patient is making multiple jokes however wife says that he usually makes many jokes.  Cranial Nerves: II: Visual Fields are full.  III,IV, VI: EOMI without ptosis or diploplia. Pupils equal, round and reactive to light V: Facial sensation is symmetric to temperature VII: Slight right facial droop VIII: hearing is intact to voice X: Palat elevates symmetrically XI: Shoulder shrug is symmetric. XII: tongue is midline without atrophy or fasciculations.  Motor: Tone is normal. Bulk is normal. 5/5 strength was present in all four extremities.  No drift No  aterixis Sensory: Sensation is symmetric to light touch and temperature in the arms and legs. DSS Deep Tendon Reflexes: 2+ and symmetric in the biceps and patellae.  Plantars Toes are downgoing bilaterally.  Cerebellar: FNF and HKS are intact bilaterally  Labs I have reviewed labs in epic and the results pertinent to this consultation are:   CBC    Component Value Date/Time   WBC 4.8 10/27/2019 0918   RBC 4.64 10/27/2019 0918   HGB 14.3 10/27/2019 0936   HCT 42.0 10/27/2019 0936   PLT 149 (L) 10/27/2019 0918   MCV 96.1 10/27/2019 0918   MCH 32.3 10/27/2019 0918   MCHC 33.6 10/27/2019 0918   RDW 12.3 10/27/2019 0918   LYMPHSABS 1.6 10/27/2019 0918   MONOABS 0.4 10/27/2019 0918   EOSABS 0.1 10/27/2019 0918   BASOSABS 0.1 10/27/2019 0918    CMP     Component Value Date/Time   NA 138 10/27/2019 0936   K 4.3 10/27/2019 0936   CL 104 10/27/2019 0936   CO2 22 10/27/2019 0918   GLUCOSE 96 10/27/2019 0936   BUN 20 10/27/2019 0936   CREATININE 1.00 10/27/2019 0936   CALCIUM 9.0 10/27/2019 0918   PROT 6.8 10/27/2019 0918   ALBUMIN 4.1 10/27/2019 9675  AST 59 (H) 10/27/2019 0918   ALT 55 (H) 10/27/2019 0918   ALKPHOS 51 10/27/2019 0918   BILITOT 1.0 10/27/2019 0918   GFRNONAA >60 10/27/2019 0918   GFRAA >60 10/27/2019 0918    Lipid Panel     Component Value Date/Time   CHOL 187 06/27/2019 1539   TRIG 195.0 (H) 06/27/2019 1539   HDL 37.00 (L) 06/27/2019 1539   CHOLHDL 5 06/27/2019 1539   VLDL 39.0 06/27/2019 1539   LDLCALC 111 (H) 06/27/2019 1539   LDLDIRECT 137.4 08/04/2011 0842     Imaging I have reviewed the images obtained:  CT-scan of the brain-no CT evidence of acute intracranial abnormality  CTA of head and neck-widely patent cervical and carotid arteries along with vertebral arteries.  Minimal intracranial atherosclerosis without large vessel occlusion or significant proximal stenosis.  MRI examination of the brain-normal exam  Etta Quill  PA-C [facility] [phone]  M-F  (9:00 am- 5:00 PM)  10/27/2019, 11:31 AM    Attending addendum Patient seen and examined Wife reports last seen normal around 8:15 AM.  He was fine at morning coffee at 730 and then again at 8:15 AM when she left.  He was planning to go out and do things around the house.  He presumably called his daughter and was asking questions repeatedly and appeared confused.  There was also apparently a witness who also had some phone calls or conversations which made no sense. He does not recollect the entire episode. On my examination, he is awake alert oriented x3, speech is not dysarthric, no aphasia, he keeps making jokes and every question, follows all commands consistently.  On cranial nerve exam, there is a mild right nasolabial fold flattening but otherwise a cranial nerve examination is unremarkable.  Motor exam did not reveal any weakness but he does have some pain while trying to flex his right hip which is chronic from his back problems. Code stroke was activated by the ED provider.  He was evaluated at the bridge. Independently reviewed imaging-CT head with no acute changes.  Absent septum pellucidum noted-likely congenital anatomical variant. Due to the symptoms of speech problems and a mild right facial asymmetry-I went along and got a stat MRI of the brain, which also I reviewed personally and reviewed with radiology-negative for acute stroke.   Assessment:  54 YO male presenting to hospital due to confusion and repeating questions.  On exam patient had a mild right facial droop.  He was not confused per se but did not have recollection of these episodes that happened in the morning with repetitive questioning. He also kept on making some situation inappropriate jokes-wife says he is a very funny guy at baseline but these jokes sounded more like things that patient is generally do when they try to overcompensate for not remembering things  etc. MRI was negative for stroke.  Diagnostic considerations include:  Transient global amnesia-TGA Less likely to be a stroke given negative imaging. Confusional episode/transient confusion- unknown etiology  Recommendations: 1.  We will try to get a stat EEG 2. If normal may be discharged home 3. can follow-up with neurology as an outpatient if symptoms recur.  Discussed plan with Dr. Sabra Heck. We will follow EEG.  -- Amie Portland, MD [facility] Pager: [phone] If 7pm to 7am, please call on call as listed on AMION.  Addendum Stat EEG-normal With normal brain imaging and EEG, I feel that the symptoms might be consistent with a TGA. I would recommend starting  him on an aspirin 81 mg. He can be discharged home if stable from the ER perspective. F/u outpatient neurology  -- Amie Portland, MD [facility] Pager: [phone] If 7pm to 7am, please call on call as listed on AMION.
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
## 2019-10-27 NOTE — ED Notes (Signed)
Patient taken to bridge for evaluation by EDP, Dr. Johnney Killian advised no code stroke at this time.

## 2019-10-27 NOTE — Progress Notes (Signed)
EEG completed, results pending. 

## 2019-10-27 NOTE — Code Documentation (Signed)
Pt arrived to [facility] via private auto at [phone]. Apparently, his wife last saw him normal at 0815 (was previously noted as 0730). Pt became confused, and the neighbors were called. He remembers having trouble thinking  Code stroke was called at 1046, because he was having  rt facial droop and lt sided weakness. Stroke team and neurologist to ED room 29 at 1045 and 1048. At that time, pt had an NIH of 0, but he does remember being confused earlier. CT head (previously obtained) was neg for hemorrhage. Back to CT for CTA at1058. No LVO per Dr Rory Percy. Pt still within treatment window so pt taken to stat MRI at 1116. Per Dr Rory Percy, MRI DWI images negative for ischemia. No TPA as NIH 0 and no stroke seen. No IR treatment as LVO negative. Handoff with Carlis Abbott RN that pt will need q 15 min VS and q 30 min mNIHSS until 1245, then q 2 hr VS and mNIHSS.

## 2019-12-22 ENCOUNTER — Ambulatory Visit: Payer: BC Managed Care – PPO | Admitting: Internal Medicine

## 2019-12-26 ENCOUNTER — Ambulatory Visit (INDEPENDENT_AMBULATORY_CARE_PROVIDER_SITE_OTHER): Payer: BC Managed Care – PPO | Admitting: Internal Medicine

## 2019-12-26 ENCOUNTER — Other Ambulatory Visit: Payer: Self-pay

## 2019-12-26 ENCOUNTER — Encounter: Payer: Self-pay | Admitting: Internal Medicine

## 2019-12-26 VITALS — BP 130/90 | HR 58 | Temp 99.0°F | Ht 71.0 in | Wt 178.0 lb

## 2019-12-26 DIAGNOSIS — J309 Allergic rhinitis, unspecified: Secondary | ICD-10-CM

## 2019-12-26 DIAGNOSIS — Z01818 Encounter for other preprocedural examination: Secondary | ICD-10-CM

## 2019-12-26 NOTE — Patient Instructions (Signed)
none

## 2019-12-26 NOTE — Progress Notes (Signed)
Pt not seen, left visit

## 2020-01-30 ENCOUNTER — Encounter: Payer: Self-pay | Admitting: Internal Medicine

## 2020-02-01 DIAGNOSIS — F4323 Adjustment disorder with mixed anxiety and depressed mood: Secondary | ICD-10-CM | POA: Diagnosis not present

## 2020-02-14 ENCOUNTER — Ambulatory Visit: Payer: BC Managed Care – PPO | Admitting: Internal Medicine

## 2020-02-16 ENCOUNTER — Ambulatory Visit: Payer: BC Managed Care – PPO | Admitting: Internal Medicine

## 2020-03-13 ENCOUNTER — Ambulatory Visit (INDEPENDENT_AMBULATORY_CARE_PROVIDER_SITE_OTHER): Payer: BC Managed Care – PPO | Admitting: Internal Medicine

## 2020-03-13 ENCOUNTER — Encounter: Payer: Self-pay | Admitting: Internal Medicine

## 2020-03-13 ENCOUNTER — Other Ambulatory Visit: Payer: Self-pay

## 2020-03-13 VITALS — BP 130/80 | HR 71 | Temp 98.7°F | Ht 71.0 in | Wt 180.0 lb

## 2020-03-13 DIAGNOSIS — Z0000 Encounter for general adult medical examination without abnormal findings: Secondary | ICD-10-CM

## 2020-03-13 DIAGNOSIS — R21 Rash and other nonspecific skin eruption: Secondary | ICD-10-CM | POA: Diagnosis not present

## 2020-03-13 DIAGNOSIS — H6992 Unspecified Eustachian tube disorder, left ear: Secondary | ICD-10-CM | POA: Insufficient documentation

## 2020-03-13 DIAGNOSIS — H6982 Other specified disorders of Eustachian tube, left ear: Secondary | ICD-10-CM | POA: Diagnosis not present

## 2020-03-13 DIAGNOSIS — J309 Allergic rhinitis, unspecified: Secondary | ICD-10-CM | POA: Diagnosis not present

## 2020-03-13 DIAGNOSIS — R945 Abnormal results of liver function studies: Secondary | ICD-10-CM | POA: Diagnosis not present

## 2020-03-13 DIAGNOSIS — R7989 Other specified abnormal findings of blood chemistry: Secondary | ICD-10-CM | POA: Insufficient documentation

## 2020-03-13 LAB — HEPATIC FUNCTION PANEL
ALT: 44 U/L (ref 0–53)
AST: 45 U/L — ABNORMAL HIGH (ref 0–37)
Albumin: 4.4 g/dL (ref 3.5–5.2)
Alkaline Phosphatase: 58 U/L (ref 39–117)
Bilirubin, Direct: 0.1 mg/dL (ref 0.0–0.3)
Total Bilirubin: 0.8 mg/dL (ref 0.2–1.2)
Total Protein: 7.3 g/dL (ref 6.0–8.3)

## 2020-03-13 MED ORDER — TRIAMCINOLONE ACETONIDE 0.1 % EX CREA: 1.0000 "application " | TOPICAL_CREAM | Freq: Two times a day (BID) | CUTANEOUS | 0 refills | Status: DC

## 2020-03-13 NOTE — Patient Instructions (Addendum)
Please take all new medication as prescribed - the steroid cream for the rash  You should also take OTC allegra and nasacort and mucinex twice daily until the left ear is improved  Please continue all other medications as before, and refills have been done if requested.  Please have the pharmacy call with any other refills you may need.  Please continue your efforts at being more active, low cholesterol diet, and weight control.  Please keep your appointments with your specialists as you may have planned  Please go to the LAB at the blood drawing area for the tests to be done - just the liver tests today  Please make an Appointment to return in Mar 2022, or sooner if needed, also with Lab Appointment for testing done 3-5 days before at the [facility] (so this is for TWO appointments - please see the scheduling desk as you leave)  Due to the ongoing Covid 19 pandemic, our lab now requires an appointment for any labs done at our office.  If you need labs done and do not have an appointment, please call our office ahead of time to schedule before presenting to the lab for your testing.
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
## 2020-03-17 ENCOUNTER — Encounter: Payer: Self-pay | Admitting: Internal Medicine

## 2020-03-17 NOTE — Assessment & Plan Note (Signed)
Mild, for otc nasacort and allegra asd,  to f/u any worsening symptoms or concerns

## 2020-03-17 NOTE — Assessment & Plan Note (Signed)
For mucinex bid prn otc,  to f/u any worsening symptoms or concerns

## 2020-03-17 NOTE — Assessment & Plan Note (Addendum)
Asympt, for f/u lab  I spent 31 minutes in preparing to see the patient by review of recent labs, imaging and procedures, obtaining and reviewing separately obtained history, communicating with the patient and family or caregiver, ordering medications, tests or procedures, and documenting clinical information in the EHR including the differential Dx, treatment, and any further evaluation and other management of abnormal lfts, rash, allergies, eustachian tube dysfxn

## 2020-03-17 NOTE — Assessment & Plan Note (Signed)
For triam cr asd,  to f/u any worsening symptoms or concerns

## 2020-04-17 ENCOUNTER — Other Ambulatory Visit: Payer: Self-pay

## 2020-04-17 ENCOUNTER — Encounter: Payer: Self-pay | Admitting: Internal Medicine

## 2020-04-17 ENCOUNTER — Ambulatory Visit (INDEPENDENT_AMBULATORY_CARE_PROVIDER_SITE_OTHER): Payer: BC Managed Care – PPO | Admitting: Internal Medicine

## 2020-04-17 VITALS — BP 132/92 | HR 94 | Temp 98.7°F | Ht 71.0 in | Wt 179.0 lb

## 2020-04-17 DIAGNOSIS — J309 Allergic rhinitis, unspecified: Secondary | ICD-10-CM | POA: Diagnosis not present

## 2020-04-17 DIAGNOSIS — E785 Hyperlipidemia, unspecified: Secondary | ICD-10-CM | POA: Diagnosis not present

## 2020-04-17 DIAGNOSIS — R030 Elevated blood-pressure reading, without diagnosis of hypertension: Secondary | ICD-10-CM

## 2020-04-17 DIAGNOSIS — R21 Rash and other nonspecific skin eruption: Secondary | ICD-10-CM | POA: Diagnosis not present

## 2020-04-17 DIAGNOSIS — L509 Urticaria, unspecified: Secondary | ICD-10-CM | POA: Diagnosis not present

## 2020-04-17 MED ORDER — PREDNISONE 10 MG PO TABS
ORAL_TABLET | ORAL | 0 refills | Status: DC
Start: 2020-04-17 — End: 2020-09-17

## 2020-04-17 MED ORDER — METHYLPREDNISOLONE ACETATE 80 MG/ML IJ SUSP
80.0000 mg | Freq: Once | INTRAMUSCULAR | Status: AC
  Administered 2020-04-17: 80 mg via INTRAMUSCULAR

## 2020-04-17 NOTE — Patient Instructions (Signed)
You had the steroid shot today  Please take all new medication as prescribed - the prednisone  Ok to continue the benadryl OTC as you have been doing as needed  Please continue all other medications as before, and refills have been done if requested.  Please have the pharmacy call with any other refills you may need.  Please keep your appointments with your specialists as you may have planned

## 2020-04-17 NOTE — Progress Notes (Signed)
Established Patient Office Visit  Subjective:  Patient ID: Todd Sims, male    DOB: 1965-06-06  Age: 55 y.o. MRN: [id number]      Chief Complaint: itching and hives       HPI:  Todd Sims is a 55 y.o. male here to f/u above x 4 days, very annoying as he scratches during the sleep as well, and lesions tend to come and go to arms and torso, without swelling Has been taking benadryl every 4 hrs but doesnot last, though does helpe some.  No obvious cuase such as soaps, other exposures, shellfish or other.   Pt denies fever, wt loss, night sweats, loss of appetite, or other constitutional symptoms  Pt denies chest pain, increased sob or doe, wheezing, orthopnea, PND, increased LE swelling, palpitations, dizziness or syncope.   Pt denies polydipsia, polyuria  Wt Readings from Last 3 Encounters:  04/17/20 179 lb (81.2 kg)  03/13/20 180 lb (81.6 kg)  12/26/19 178 lb (80.7 kg)   BP Readings from Last 3 Encounters:  04/17/20 (!) 132/92  03/13/20 130/80  12/26/19 130/90    Past Medical History:  Diagnosis Date  . Allergic rhinitis   . ALLERGIC RHINITIS 08/26/2007  . Allergy   . Anxiety 08/06/2010  . Cervical neck pain with evidence of disc disease 08/06/2010  . Chronic prostatitis 08/07/2011  . Degenerative disk disease    lumbar and C- spine  . Depression   . GERD 04/18/2010  . GERD (gastroesophageal reflux disease)   . Hyperlipemia   . HYPERLIPIDEMIA 08/26/2007  . Neck pain   . Premature ejaculation 08/06/2010  . Shoulder pain, right   . Thoracic outlet syndrome 08/07/2011   Past Surgical History:  Procedure Laterality Date  . THORACIC OUTLET SURGERY Right 2014    reports that he has never smoked. He has never used smokeless tobacco. He reports current alcohol use of about 2.0 standard drinks of alcohol per week. He reports that he does not use drugs. family history includes Colon polyps in his father; Lung cancer in his mother. No Known Allergies Current Outpatient  Medications on File Prior to Visit  Medication Sig Dispense Refill  . oxyCODONE-acetaminophen (PERCOCET/ROXICET) 5-325 MG tablet Take 1 tablet by mouth every 6 (six) hours as needed.    . sertraline (ZOLOFT) 100 MG tablet Take 100 mg by mouth daily.    Marland Kitchen triamcinolone (KENALOG) 0.1 % Apply 1 application topically 2 (two) times daily. 30 g 0   No current facility-administered medications on file prior to visit.        ROS:  All others reviewed and negative.  Objective        PE:  BP (!) 132/92 (BP Location: Left Arm, Patient Position: Sitting, Cuff Size: Large)   Pulse 94   Temp 98.7 F (37.1 C) (Oral)   Ht 5\' 11"  (1.803 m)   Wt 179 lb (81.2 kg)   SpO2 93%   BMI 24.97 kg/m                 Constitutional: Pt appears in NAD               HENT: Head: NCAT.                Right Ear: External ear normal.                 Left Ear: External ear normal.  Eyes: . Pupils are equal, round, and reactive to light. Conjunctivae and EOM are normal               Nose: without d/c or deformity               Neck: Neck supple. Gross normal ROM               Cardiovascular: Normal rate and regular rhythm.                 Pulmonary/Chest: Effort normal and breath sounds without rales or wheezing.                Abd:  Soft, NT, ND, + BS, no organomegaly               Neurological: Pt is alert. At baseline orientation, motor grossly intact               Skin: Skin is warm. No rashes, no other new lesions, LE edema - none               Psychiatric: Pt behavior is normal without agitation   Assessment/Plan:  [name] is a 55 y.o. White or Caucasian [1] male with  has a past medical history of Allergic rhinitis, ALLERGIC RHINITIS (08/26/2007), Allergy, Anxiety (08/06/2010), Cervical neck pain with evidence of disc disease (08/06/2010), Chronic prostatitis (08/07/2011), Degenerative disk disease, Depression, GERD (04/18/2010), GERD (gastroesophageal reflux disease), Hyperlipemia,  HYPERLIPIDEMIA (08/26/2007), Neck pain, Premature ejaculation (08/06/2010), Shoulder pain, right, and Thoracic outlet syndrome (08/07/2011).   Assessment Plan  See problem oriented assessment and plan Labs/data reviewed for each problem:  Micro: none  Cardiac tracings I have personally interpreted today:  none  Pertinent Radiological findings (summarize): none    Health Maintenance Due  Topic Date Due  . Hepatitis C Screening  Never done  . COVID-19 Vaccine (1) Never done  . INFLUENZA VACCINE  Never done    There are no preventive care reminders to display for this patient.  Lab Results  Component Value Date   TSH 1.20 06/27/2019   Lab Results  Component Value Date   WBC 4.8 10/27/2019   HGB 14.3 10/27/2019   HCT 42.0 10/27/2019   MCV 96.1 10/27/2019   PLT 149 (L) 10/27/2019   Lab Results  Component Value Date   NA 138 10/27/2019   K 4.3 10/27/2019   CO2 22 10/27/2019   GLUCOSE 96 10/27/2019   BUN 20 10/27/2019   CREATININE 1.00 10/27/2019   BILITOT 0.8 03/13/2020   ALKPHOS 58 03/13/2020   AST 45 (H) 03/13/2020   ALT 44 03/13/2020   PROT 7.3 03/13/2020   ALBUMIN 4.4 03/13/2020   CALCIUM 9.0 10/27/2019   ANIONGAP 9 10/27/2019   GFR 83.07 06/27/2019   Lab Results  Component Value Date   CHOL 187 06/27/2019   Lab Results  Component Value Date   HDL 37.00 (L) 06/27/2019   Lab Results  Component Value Date   LDLCALC 111 (H) 06/27/2019   Lab Results  Component Value Date   TRIG 195.0 (H) 06/27/2019   Lab Results  Component Value Date   CHOLHDL 5 06/27/2019   No results found for: HGBA1C    Assessment & Plan:   Problem List Items Addressed This Visit      Medium   Hyperlipidemia    Lab Results  Component Value Date   LDLCALC 111 (H) 06/27/2019   Stable, pt to continue current statin  -  diet, declines statin      Hives - Primary    C/w hives without angioedema or anaphylaxis, for depomedrol IM 80, cont benadry prn as tolerates well but  also predpac asd       Blood pressure elevated without history of HTN    Mild,, likely reactive, to cont to f/u BP at home dialy for 10 days and call with BP > 140;/90      Allergic rhinitis    Also to improve temporarily with prednisone         Meds ordered this encounter  Medications  . predniSONE (DELTASONE) 10 MG tablet    Sig: 3 tabs by mouth per day for 3 days,2tabs per day for 3 days,1tab per day for 3 days    Dispense:  18 tablet    Refill:  0  . methylPREDNISolone acetate (DEPO-MEDROL) injection 80 mg    Follow-up: Return if symptoms worsen or fail to improve.   Oliver Barre, MD 04/29/2020 8:52 PM [facility]

## 2020-04-24 DIAGNOSIS — Z0000 Encounter for general adult medical examination without abnormal findings: Secondary | ICD-10-CM | POA: Diagnosis not present

## 2020-04-29 ENCOUNTER — Encounter: Payer: Self-pay | Admitting: Internal Medicine

## 2020-04-29 NOTE — Assessment & Plan Note (Signed)
Mild,, likely reactive, to cont to f/u BP at home dialy for 10 days and call with BP > 140;/90

## 2020-04-29 NOTE — Assessment & Plan Note (Signed)
Lab Results  Component Value Date   LDLCALC 111 (H) 06/27/2019   Stable, pt to continue current statin  - diet, declines statin

## 2020-04-29 NOTE — Assessment & Plan Note (Signed)
Also to improve temporarily with prednisone

## 2020-04-29 NOTE — Assessment & Plan Note (Signed)
C/w hives without angioedema or anaphylaxis, for depomedrol IM 80, cont benadry prn as tolerates well but also predpac asd

## 2020-04-29 NOTE — Assessment & Plan Note (Signed)
C/w hives without angioedema or anaphylaxis, for depomedrol IM 80, cont benadry prn as tolerates well but also predpac asd 

## 2020-05-02 DIAGNOSIS — Z0000 Encounter for general adult medical examination without abnormal findings: Secondary | ICD-10-CM | POA: Diagnosis not present

## 2020-05-02 DIAGNOSIS — Z1159 Encounter for screening for other viral diseases: Secondary | ICD-10-CM | POA: Diagnosis not present

## 2020-07-11 ENCOUNTER — Encounter: Payer: BC Managed Care – PPO | Admitting: Internal Medicine

## 2020-08-07 ENCOUNTER — Other Ambulatory Visit: Payer: Self-pay | Admitting: Internal Medicine

## 2020-08-07 NOTE — Telephone Encounter (Signed)
Dr. Jenny Reichmann, per patient's chart, it shows historical provider has given Sertraline. Do you want to refill? Please Advise.

## 2020-08-29 DIAGNOSIS — F4323 Adjustment disorder with mixed anxiety and depressed mood: Secondary | ICD-10-CM | POA: Diagnosis not present

## 2020-09-12 ENCOUNTER — Encounter: Payer: Self-pay | Admitting: Internal Medicine

## 2020-09-17 ENCOUNTER — Ambulatory Visit (INDEPENDENT_AMBULATORY_CARE_PROVIDER_SITE_OTHER): Payer: BC Managed Care – PPO | Admitting: Internal Medicine

## 2020-09-17 ENCOUNTER — Encounter: Payer: Self-pay | Admitting: Internal Medicine

## 2020-09-17 ENCOUNTER — Other Ambulatory Visit: Payer: Self-pay

## 2020-09-17 VITALS — BP 128/76 | HR 70 | Temp 98.3°F | Ht 71.0 in | Wt 181.0 lb

## 2020-09-17 DIAGNOSIS — E538 Deficiency of other specified B group vitamins: Secondary | ICD-10-CM | POA: Diagnosis not present

## 2020-09-17 DIAGNOSIS — Z125 Encounter for screening for malignant neoplasm of prostate: Secondary | ICD-10-CM | POA: Diagnosis not present

## 2020-09-17 DIAGNOSIS — E559 Vitamin D deficiency, unspecified: Secondary | ICD-10-CM | POA: Diagnosis not present

## 2020-09-17 DIAGNOSIS — F419 Anxiety disorder, unspecified: Secondary | ICD-10-CM

## 2020-09-17 DIAGNOSIS — Z1159 Encounter for screening for other viral diseases: Secondary | ICD-10-CM | POA: Diagnosis not present

## 2020-09-17 DIAGNOSIS — E7800 Pure hypercholesterolemia, unspecified: Secondary | ICD-10-CM | POA: Diagnosis not present

## 2020-09-17 DIAGNOSIS — Z0000 Encounter for general adult medical examination without abnormal findings: Secondary | ICD-10-CM | POA: Diagnosis not present

## 2020-09-17 DIAGNOSIS — Z0001 Encounter for general adult medical examination with abnormal findings: Secondary | ICD-10-CM

## 2020-09-17 DIAGNOSIS — W57XXXA Bitten or stung by nonvenomous insect and other nonvenomous arthropods, initial encounter: Secondary | ICD-10-CM | POA: Insufficient documentation

## 2020-09-17 LAB — CBC WITH DIFFERENTIAL/PLATELET
Basophils Absolute: 0 10*3/uL (ref 0.0–0.1)
Basophils Relative: 1.1 % (ref 0.0–3.0)
Eosinophils Absolute: 0.1 10*3/uL (ref 0.0–0.7)
Eosinophils Relative: 2.3 % (ref 0.0–5.0)
HCT: 43.1 % (ref 39.0–52.0)
Hemoglobin: 15.1 g/dL (ref 13.0–17.0)
Lymphocytes Relative: 31.7 % (ref 12.0–46.0)
Lymphs Abs: 1.4 10*3/uL (ref 0.7–4.0)
MCHC: 35.1 g/dL (ref 30.0–36.0)
MCV: 94.3 fl (ref 78.0–100.0)
Monocytes Absolute: 0.4 10*3/uL (ref 0.1–1.0)
Monocytes Relative: 9 % (ref 3.0–12.0)
Neutro Abs: 2.4 10*3/uL (ref 1.4–7.7)
Neutrophils Relative %: 55.9 % (ref 43.0–77.0)
Platelets: 138 10*3/uL — ABNORMAL LOW (ref 150.0–400.0)
RBC: 4.57 Mil/uL (ref 4.22–5.81)
RDW: 12.3 % (ref 11.5–15.5)
WBC: 4.4 10*3/uL (ref 4.0–10.5)

## 2020-09-17 LAB — BASIC METABOLIC PANEL
BUN: 14 mg/dL (ref 6–23)
CO2: 23 mEq/L (ref 19–32)
Calcium: 9.3 mg/dL (ref 8.4–10.5)
Chloride: 104 mEq/L (ref 96–112)
Creatinine, Ser: 0.99 mg/dL (ref 0.40–1.50)
GFR: 86.8 mL/min (ref >60.00)
Glucose, Bld: 85 mg/dL (ref 70–99)
Potassium: 4.6 mEq/L (ref 3.5–5.1)
Sodium: 139 mEq/L (ref 135–145)

## 2020-09-17 LAB — LIPID PANEL
Cholesterol: 218 mg/dL — ABNORMAL HIGH (ref 0–200)
HDL: 42.5 mg/dL (ref >39.00)
LDL Cholesterol: 147 mg/dL — ABNORMAL HIGH (ref 0–99)
NonHDL: 175.42
Total CHOL/HDL Ratio: 5
Triglycerides: 143 mg/dL (ref 0.0–149.0)
VLDL: 28.6 mg/dL (ref 0.0–40.0)

## 2020-09-17 LAB — TSH: TSH: 1.65 u[IU]/mL (ref 0.35–4.50)

## 2020-09-17 LAB — URINALYSIS, ROUTINE W REFLEX MICROSCOPIC
Bilirubin Urine: NEGATIVE
Hgb urine dipstick: NEGATIVE
Ketones, ur: NEGATIVE
Leukocytes,Ua: NEGATIVE
Nitrite: NEGATIVE
RBC / HPF: NONE SEEN (ref 0–?)
Specific Gravity, Urine: 1.02 (ref 1.000–1.030)
Total Protein, Urine: NEGATIVE
Urine Glucose: NEGATIVE
Urobilinogen, UA: 0.2 (ref 0.0–1.0)
pH: 5.5 (ref 5.0–8.0)

## 2020-09-17 LAB — HEPATIC FUNCTION PANEL
ALT: 36 U/L (ref 0–53)
AST: 41 U/L — ABNORMAL HIGH (ref 0–37)
Albumin: 4.3 g/dL (ref 3.5–5.2)
Alkaline Phosphatase: 59 U/L (ref 39–117)
Bilirubin, Direct: 0.1 mg/dL (ref 0.0–0.3)
Total Bilirubin: 0.7 mg/dL (ref 0.2–1.2)
Total Protein: 7 g/dL (ref 6.0–8.3)

## 2020-09-17 LAB — VITAMIN D 25 HYDROXY (VIT D DEFICIENCY, FRACTURES): VITD: 21.66 ng/mL — ABNORMAL LOW (ref 30.00–100.00)

## 2020-09-17 LAB — PSA: PSA: 0.82 ng/mL (ref 0.10–4.00)

## 2020-09-17 LAB — VITAMIN B12: Vitamin B-12: 304 pg/mL (ref 211–911)

## 2020-09-17 MED ORDER — SERTRALINE HCL 100 MG PO TABS: 1.0000 | ORAL_TABLET | Freq: Every day | ORAL | 3 refills | Status: DC

## 2020-09-17 NOTE — Progress Notes (Signed)
Patient ID: Todd Sims, male   DOB: 29-Nov-1965, 55 y.o.   MRN: [id number]         Chief Complaint:: wellness exam       HPI:  Todd Sims is a 55 y.o. male here for wellness exam; due for hep c screen, delcines shignrix and colnoscopy, o/w up to date with preventive referrals and immunizations  No new complaints   Wt Readings from Last 3 Encounters:  09/17/20 181 lb (82.1 kg)  04/17/20 179 lb (81.2 kg)  03/13/20 180 lb (81.6 kg)   BP Readings from Last 3 Encounters:  09/17/20 128/76  04/17/20 (!) 132/92  03/13/20 130/80   Immunization History  Administered Date(s) Administered  . Tdap 08/07/2011   Health Maintenance Due  Topic Date Due  . Hepatitis C Screening  Never done      Past Medical History:  Diagnosis Date  . Allergic rhinitis   . ALLERGIC RHINITIS 08/26/2007  . Allergy   . Anxiety 08/06/2010  . Cervical neck pain with evidence of disc disease 08/06/2010  . Chronic prostatitis 08/07/2011  . Degenerative disk disease    lumbar and C- spine  . Depression   . GERD 04/18/2010  . GERD (gastroesophageal reflux disease)   . Hyperlipemia   . HYPERLIPIDEMIA 08/26/2007  . Neck pain   . Premature ejaculation 08/06/2010  . Shoulder pain, right   . Thoracic outlet syndrome 08/07/2011   Past Surgical History:  Procedure Laterality Date  . THORACIC OUTLET SURGERY Right 2014    reports that he has never smoked. He has never used smokeless tobacco. He reports current alcohol use of about 2.0 standard drinks of alcohol per week. He reports that he does not use drugs. family history includes Colon polyps in his father; Lung cancer in his mother. No Known Allergies No current outpatient medications on file prior to visit.   No current facility-administered medications on file prior to visit.        ROS:  All others reviewed and negative.  Objective        PE:  BP 128/76 (BP Location: Left Arm, Patient Position: Sitting, Cuff Size: Normal)   Pulse 70   Temp 98.3 F  (36.8 C) (Oral)   Ht 5\' 11"  (1.803 m)   Wt 181 lb (82.1 kg)   SpO2 96%   BMI 25.24 kg/m                 Constitutional: Pt appears in NAD               HENT: Head: NCAT.                Right Ear: External ear normal.                 Left Ear: External ear normal.                Eyes: . Pupils are equal, round, and reactive to light. Conjunctivae and EOM are normal               Nose: without d/c or deformity               Neck: Neck supple. Gross normal ROM               Cardiovascular: Normal rate and regular rhythm.                 Pulmonary/Chest: Effort normal and breath sounds without rales or  wheezing.                Abd:  Soft, NT, ND, + BS, no organomegaly               Neurological: Pt is alert. At baseline orientation, motor grossly intact               Skin: Skin is warm. No rashes, no other new lesions, LE edema - none               Psychiatric: Pt behavior is normal without agitation   Micro: none  Cardiac tracings I have personally interpreted today:  none  Pertinent Radiological findings (summarize): none   Lab Results  Component Value Date   WBC 4.4 09/17/2020   HGB 15.1 09/17/2020   HCT 43.1 09/17/2020   PLT 138.0 (L) 09/17/2020   GLUCOSE 85 09/17/2020   CHOL 218 (H) 09/17/2020   TRIG 143.0 09/17/2020   HDL 42.50 09/17/2020   LDLDIRECT 137.4 08/04/2011   LDLCALC 147 (H) 09/17/2020   ALT 36 09/17/2020   AST 41 (H) 09/17/2020   NA 139 09/17/2020   K 4.6 09/17/2020   CL 104 09/17/2020   CREATININE 0.99 09/17/2020   BUN 14 09/17/2020   CO2 23 09/17/2020   TSH 1.65 09/17/2020   PSA 0.82 09/17/2020   INR 1.0 10/27/2019   Assessment/Plan:  Todd Sims is a 55 y.o. White or Caucasian [1] male with  has a past medical history of Allergic rhinitis, ALLERGIC RHINITIS (08/26/2007), Allergy, Anxiety (08/06/2010), Cervical neck pain with evidence of disc disease (08/06/2010), Chronic prostatitis (08/07/2011), Degenerative disk disease, Depression, GERD  (04/18/2010), GERD (gastroesophageal reflux disease), Hyperlipemia, HYPERLIPIDEMIA (08/26/2007), Neck pain, Premature ejaculation (08/06/2010), Shoulder pain, right, and Thoracic outlet syndrome (08/07/2011).  Preventative health care Age and sex appropriate education and counseling updated with regular exercise and diet Referrals for preventative services - for hep c screen, declines colnoscopy Immunizations addressed - declines shingrix Smoking counseling  - none needed Evidence for depression or other mood disorder - none significant Most recent labs reviewed. I have personally reviewed and have noted: 1) the patient's medical and social history 2) The patient's current medications and supplements 3) The patient's height, weight, and BMI have been recorded in the chart   Hyperlipidemia Lab Results  Component Value Date   LDLCALC 147 (H) 09/17/2020   Stable, pt to continue current low chol diet, declines statin   Anxiety Stable, cont zoloft  Followup: Return in about 1 year (around 09/17/2021).  Cathlean Cower, MD 09/17/2020 10:15 PM [facility]

## 2020-09-17 NOTE — Assessment & Plan Note (Signed)
Stable, cont zoloft

## 2020-09-17 NOTE — Assessment & Plan Note (Signed)
Age and sex appropriate education and counseling updated with regular exercise and diet Referrals for preventative services - for hep c screen, declines colnoscopy Immunizations addressed - declines shingrix Smoking counseling  - none needed Evidence for depression or other mood disorder - none significant Most recent labs reviewed. I have personally reviewed and have noted: 1) the patient's medical and social history 2) The patient's current medications and supplements 3) The patient's height, weight, and BMI have been recorded in the chart

## 2020-09-17 NOTE — Patient Instructions (Addendum)
Please check with insurance and call (or go to [facility]) if the insurance covers the Shingrix shot  Please call if you change your mind about the colonoscopy  Please continue all other medications as before, and refills have been done if requested - zoloft  Please have the pharmacy call with any other refills you may need.  Please continue your efforts at being more active, low cholesterol diet, and weight control.  You are otherwise up to date with prevention measures today.  Please keep your appointments with your specialists as you may have planned  Please go to the LAB at the blood drawing area for the tests to be done  You will be contacted by phone if any changes need to be made immediately.  Otherwise, you will receive a letter about your results with an explanation, but please check with MyChart first.  Please remember to sign up for MyChart if you have not done so, as this will be important to you in the future with finding out test results, communicating by private email, and scheduling acute appointments online when needed.  Please make an Appointment to return for your 1 year visit, or sooner if needed

## 2020-09-17 NOTE — Assessment & Plan Note (Signed)
Lab Results  Component Value Date   LDLCALC 147 (H) 09/17/2020   Stable, pt to continue current low chol diet, declines statin

## 2020-09-18 LAB — HEPATITIS C ANTIBODY
Hepatitis C Ab: NONREACTIVE
SIGNAL TO CUT-OFF: 0 (ref <1.00)

## 2020-12-10 DIAGNOSIS — B078 Other viral warts: Secondary | ICD-10-CM | POA: Diagnosis not present

## 2020-12-10 DIAGNOSIS — C44519 Basal cell carcinoma of skin of other part of trunk: Secondary | ICD-10-CM | POA: Diagnosis not present

## 2020-12-10 DIAGNOSIS — L821 Other seborrheic keratosis: Secondary | ICD-10-CM | POA: Diagnosis not present

## 2020-12-10 DIAGNOSIS — L570 Actinic keratosis: Secondary | ICD-10-CM | POA: Diagnosis not present

## 2020-12-10 DIAGNOSIS — L814 Other melanin hyperpigmentation: Secondary | ICD-10-CM | POA: Diagnosis not present

## 2020-12-18 ENCOUNTER — Other Ambulatory Visit: Payer: Self-pay

## 2020-12-18 ENCOUNTER — Encounter: Payer: Self-pay | Admitting: Gastroenterology

## 2020-12-18 ENCOUNTER — Encounter: Payer: Self-pay | Admitting: Internal Medicine

## 2020-12-18 ENCOUNTER — Ambulatory Visit (INDEPENDENT_AMBULATORY_CARE_PROVIDER_SITE_OTHER): Payer: BC Managed Care – PPO | Admitting: Internal Medicine

## 2020-12-18 VITALS — BP 144/96 | HR 59 | Temp 99.1°F | Ht 71.0 in | Wt 183.0 lb

## 2020-12-18 DIAGNOSIS — M79674 Pain in right toe(s): Secondary | ICD-10-CM | POA: Diagnosis not present

## 2020-12-18 DIAGNOSIS — J309 Allergic rhinitis, unspecified: Secondary | ICD-10-CM | POA: Diagnosis not present

## 2020-12-18 DIAGNOSIS — M7989 Other specified soft tissue disorders: Secondary | ICD-10-CM

## 2020-12-18 DIAGNOSIS — R030 Elevated blood-pressure reading, without diagnosis of hypertension: Secondary | ICD-10-CM | POA: Diagnosis not present

## 2020-12-18 DIAGNOSIS — Z23 Encounter for immunization: Secondary | ICD-10-CM

## 2020-12-18 NOTE — Progress Notes (Signed)
Patient ID: ECHO TOPP, male   DOB: 10/06/1965, 55 y.o.   MRN: [id number]        Chief Complaint: follow up allergies, right 5th toe pain, elev BP       HPI:  Todd Sims is a 55 y.o. male here with c/o hitting right 5th toe on hard object 2 wks ago with persistent swelling and pain he though would be resolved.  Pt denies chest pain, increased sob or doe, wheezing, orthopnea, PND, increased LE swelling, palpitations, dizziness or syncope.   Pt denies polydipsia, polyuria, or new focal neuro s/s.  Does have several wks ongoing nasal allergy symptoms with clearish congestion, itch and sneezing, without fever, pain, ST, cough, swelling or wheezing.  BP at home has been < 140/90, does no think he needs meds today  Wt Readings from Last 3 Encounters:  12/18/20 183 lb (83 kg)  09/17/20 181 lb (82.1 kg)  04/17/20 179 lb (81.2 kg)   BP Readings from Last 3 Encounters:  12/18/20 (!) 144/96  09/17/20 128/76  04/17/20 (!) 132/92         Past Medical History:  Diagnosis Date   Allergic rhinitis    ALLERGIC RHINITIS 08/26/2007   Allergy    Anxiety 08/06/2010   Cervical neck pain with evidence of disc disease 08/06/2010   Chronic prostatitis 08/07/2011   Degenerative disk disease    lumbar and C- spine   Depression    GERD 04/18/2010   GERD (gastroesophageal reflux disease)    Hyperlipemia    HYPERLIPIDEMIA 08/26/2007   Neck pain    Premature ejaculation 08/06/2010   Shoulder pain, right    Thoracic outlet syndrome 08/07/2011   Past Surgical History:  Procedure Laterality Date   THORACIC OUTLET SURGERY Right 2014    reports that he has never smoked. He has never used smokeless tobacco. He reports current alcohol use of about 2.0 standard drinks per week. He reports that he does not use drugs. family history includes Colon polyps in his father; Lung cancer in his mother. No Known Allergies Current Outpatient Medications on File Prior to Visit  Medication Sig Dispense Refill    sertraline (ZOLOFT) 100 MG tablet Take 1 tablet (100 mg total) by mouth daily. 90 tablet 3   fluorouracil (EFUDEX) 5 % cream Apply topically. (Patient not taking: Reported on 12/18/2020)     No current facility-administered medications on file prior to visit.        ROS:  All others reviewed and negative.  Objective        PE:  BP (!) 144/96 (BP Location: Left Arm, Patient Position: Sitting, Cuff Size: Normal)   Pulse (!) 59   Temp 99.1 F (37.3 C) (Oral)   Ht '5\' 11"'$  (1.803 m)   Wt 183 lb (83 kg)   SpO2 97%   BMI 25.52 kg/m                 Constitutional: Pt appears in NAD               HENT: Head: NCAT.                Right Ear: External ear normal.                 Left Ear: External ear normal.                Eyes: . Pupils are equal, round, and reactive to light. Conjunctivae and EOM are  normal               Nose: without d/c or deformity               Neck: Neck supple. Gross normal ROM               Cardiovascular: Normal rate and regular rhythm.                 Pulmonary/Chest: Effort normal and breath sounds without rales or wheezing.                Abd:  Soft, NT, ND, + BS, no organomegaly               Neurological: Pt is alert. At baseline orientation, motor grossly intact; right 5th toe with 1+ red, mild tender erythema but no red streaks, ulcer or drainage                Skin: Skin is warm. No rashes, no other new lesions, LE edema - none               Psychiatric: Pt behavior is normal without agitation   Micro: none  Cardiac tracings I have personally interpreted today:  none  Pertinent Radiological findings (summarize): none   Lab Results  Component Value Date   WBC 4.4 09/17/2020   HGB 15.1 09/17/2020   HCT 43.1 09/17/2020   PLT 138.0 (L) 09/17/2020   GLUCOSE 85 09/17/2020   CHOL 218 (H) 09/17/2020   TRIG 143.0 09/17/2020   HDL 42.50 09/17/2020   LDLDIRECT 137.4 08/04/2011   LDLCALC 147 (H) 09/17/2020   ALT 36 09/17/2020   AST 41 (H) 09/17/2020   NA  139 09/17/2020   K 4.6 09/17/2020   CL 104 09/17/2020   CREATININE 0.99 09/17/2020   BUN 14 09/17/2020   CO2 23 09/17/2020   TSH 1.65 09/17/2020   PSA 0.82 09/17/2020   INR 1.0 10/27/2019   Assessment/Plan:  Todd Sims is a 55 y.o. White or Caucasian [1] male with  has a past medical history of Allergic rhinitis, ALLERGIC RHINITIS (08/26/2007), Allergy, Anxiety (08/06/2010), Cervical neck pain with evidence of disc disease (08/06/2010), Chronic prostatitis (08/07/2011), Degenerative disk disease, Depression, GERD (04/18/2010), GERD (gastroesophageal reflux disease), Hyperlipemia, HYPERLIPIDEMIA (08/26/2007), Neck pain, Premature ejaculation (08/06/2010), Shoulder pain, right, and Thoracic outlet syndrome (08/07/2011).  Pain and swelling of toe of right foot Right fifth toe, mild, d/w pt natrual history and appaers to likely underlying fracture and typical healing, ok to follow with buddy taping  Blood pressure elevated without history of HTN Uncontrolled here, but pt states overall stable at home, will continue low salt diet and f/u bp at home and next visit  Allergic rhinitis Mild to mod, for nasacort asd,  to f/u any worsening symptoms or concerns  Followup: Return if symptoms worsen or fail to improve.  Cathlean Cower, MD 12/21/2020 10:15 PM [place]

## 2020-12-21 ENCOUNTER — Encounter: Payer: Self-pay | Admitting: Internal Medicine

## 2020-12-21 DIAGNOSIS — M7989 Other specified soft tissue disorders: Secondary | ICD-10-CM | POA: Insufficient documentation

## 2020-12-21 DIAGNOSIS — M79674 Pain in right toe(s): Secondary | ICD-10-CM | POA: Insufficient documentation

## 2020-12-21 NOTE — Patient Instructions (Signed)
Please take all new medication as prescribed 

## 2020-12-21 NOTE — Assessment & Plan Note (Signed)
Uncontrolled here, but pt states overall stable at home, will continue low salt diet and f/u bp at home and next visit

## 2020-12-21 NOTE — Assessment & Plan Note (Signed)
Right fifth toe, mild, d/w pt natrual history and appaers to likely underlying fracture and typical healing, ok to follow with buddy taping

## 2020-12-21 NOTE — Assessment & Plan Note (Signed)
Mild to mod, for nasacort asd,  to f/u any worsening symptoms or concerns 

## 2021-01-16 DIAGNOSIS — C44519 Basal cell carcinoma of skin of other part of trunk: Secondary | ICD-10-CM | POA: Diagnosis not present

## 2021-02-03 ENCOUNTER — Other Ambulatory Visit: Payer: Self-pay | Admitting: Internal Medicine

## 2021-02-13 ENCOUNTER — Ambulatory Visit (INDEPENDENT_AMBULATORY_CARE_PROVIDER_SITE_OTHER): Payer: BC Managed Care – PPO

## 2021-02-13 ENCOUNTER — Other Ambulatory Visit: Payer: Self-pay

## 2021-02-13 DIAGNOSIS — Z23 Encounter for immunization: Secondary | ICD-10-CM | POA: Diagnosis not present

## 2021-02-13 NOTE — Progress Notes (Signed)
Pt was given 2nd shingles vacc w/o any complications. 

## 2021-02-17 ENCOUNTER — Ambulatory Visit: Payer: BC Managed Care – PPO

## 2021-02-25 ENCOUNTER — Encounter: Payer: Self-pay | Admitting: Internal Medicine

## 2021-02-25 ENCOUNTER — Ambulatory Visit (INDEPENDENT_AMBULATORY_CARE_PROVIDER_SITE_OTHER): Payer: BC Managed Care – PPO | Admitting: Internal Medicine

## 2021-02-25 DIAGNOSIS — G8929 Other chronic pain: Secondary | ICD-10-CM

## 2021-02-25 DIAGNOSIS — M4722 Other spondylosis with radiculopathy, cervical region: Secondary | ICD-10-CM | POA: Insufficient documentation

## 2021-02-25 DIAGNOSIS — M545 Low back pain, unspecified: Secondary | ICD-10-CM | POA: Diagnosis not present

## 2021-02-25 MED ORDER — KETOROLAC TROMETHAMINE 60 MG/2ML IM SOLN
60.0000 mg | Freq: Once | INTRAMUSCULAR | Status: AC
  Administered 2021-02-25: 60 mg via INTRAMUSCULAR

## 2021-02-25 MED ORDER — METHYLPREDNISOLONE 4 MG PO TBPK: ORAL_TABLET | ORAL | 0 refills | Status: DC

## 2021-02-25 MED ORDER — OXYCODONE-ACETAMINOPHEN 10-325 MG PO TABS: 0.5000 | ORAL_TABLET | Freq: Four times a day (QID) | ORAL | 0 refills | Status: AC | PRN

## 2021-02-25 NOTE — Patient Instructions (Signed)
Cervical Radiculopathy ?Cervical radiculopathy means that a nerve in the neck (a cervical nerve) is pinched or bruised. This can happen because of an injury to the cervical spine (vertebrae) in the neck, or as a normal part of getting older. This condition can cause pain or loss of feeling (numbness) that runs from your neck all the way down to your arm and fingers. Often, this condition gets better with rest. Treatment may be needed if the condition does not get better. ?What are the causes? ?A neck injury. ?A bulging disk in your spine. ?Sudden muscle tightening (muscle spasms). ?Tight muscles in your neck due to overuse. ?Arthritis. ?Breakdown in the bones and joints of the spine (spondylosis) due to getting older. ?Bone spurs that form near the nerves in the neck. ?What are the signs or symptoms? ?Pain. The pain may: ?Run from the neck to the arm and hand. ?Be very bad or irritating. ?Get worse when you move your neck. ?Loss of feeling or tingling in your arm or hand. ?Weakness in your arm or hand, in very bad cases. ?How is this treated? ?In many cases, treatment is not needed for this condition. With rest, the condition often gets better over time. If treatment is needed, options may include: ?Wearing a soft neck collar (cervical collar) for short periods of time. ?Doing exercises (physical therapy) to strengthen your neck muscles. ?Taking medicines. ?Having shots (injections) in your spine, in very bad cases. ?Having surgery. This may be needed if other treatments do not help. The type of surgery that is used will depend on the cause of your condition. ?Follow these instructions at home: ?If you have a soft neck collar: ?Wear it as told by your doctor. Take it off only as told by your doctor. ?Ask your doctor if you can take the collar off for cleaning and bathing. If you are allowed to take the collar off for cleaning or bathing: ?Follow instructions from your doctor about how to take off the collar  safely. ?Clean the collar by wiping it with mild soap and water and drying it completely. ?Take out any removable pads in the collar every 1-2 days. Wash them by hand with soap and water. Let them air-dry completely before you put them back in the collar. ?Check your skin under the collar for redness or sores. If you see any, tell your doctor. ?Managing pain ?  ?Take over-the-counter and prescription medicines only as told by your doctor. ?If told, put ice on the painful area. To do this: ?If you have a soft neck collar, take if off as told by your doctor. ?Put ice in a plastic bag. ?Place a towel between your skin and the bag. ?Leave the ice on for 20 minutes, 2-3 times a day. ?Take off the ice if your skin turns bright red. This is very important. If you cannot feel pain, heat, or cold, you have a greater risk of damage to the area. ?If using ice does not help, you can try using heat. Use the heat source that your doctor recommends, such as a moist heat pack or a heating pad. ?Place a towel between your skin and the heat source. ?Leave the heat on for 20-30 minutes. ?Take off the heat if your skin turns bright red. This is very important. If you cannot feel pain, heat, or cold, you have a greater risk of getting burned. ?You may try a gentle neck and shoulder rub (massage). ?Activity ?Rest as needed. ?Return to your normal   activities when your doctor says that it is safe. ?Do exercises as told by your doctor or physical therapist. ?You may have to avoid lifting. Ask your doctor how much you can safely lift. ?General instructions ?Use a flat pillow when you sleep. ?Do not drive while wearing a soft neck collar. If you do not have a soft neck collar, ask your doctor if it is safe to drive while your neck heals. ?Ask your doctor if you should avoid driving or using machines while you are taking your medicine. ?Do not smoke or use any products that contain nicotine or tobacco. If you need help quitting, ask your  doctor. ?Keep all follow-up visits. ?Contact a doctor if: ?Your condition does not get better with treatment. ?Get help right away if: ?Your pain gets worse and medicine does not help. ?You lose feeling or feel weak in your hand, arm, face, or leg. ?You have a high fever. ?Your neck is stiff. ?You cannot control when you poop or pee (have incontinence). ?You have trouble with walking, balance, or talking. ?Summary ?Cervical radiculopathy means that a nerve in the neck is pinched or bruised. ?A nerve can get pinched from a bulging disk, arthritis, an injury to the neck, or other causes. ?Symptoms include pain, tingling, or loss of feeling that goes from the neck to the arm or hand. ?Weakness in your arm or hand can happen in very bad cases. ?Treatment may include resting, wearing a soft neck collar, and doing exercises. You might need to take medicines for pain. In very bad cases, shots or surgery may be needed. ?This information is not intended to replace advice given to you by your health care provider. Make sure you discuss any questions you have with your health care provider. ?Document Revised: 10/03/2020 Document Reviewed: 10/03/2020 ?Elsevier Patient Education ? 2022 [employer] ? ?

## 2021-02-25 NOTE — Progress Notes (Signed)
Subjective:  Patient ID: Todd Sims, male    DOB: 04-Jul-1965  Age: 55 y.o. MRN: [id number]  CC: Shoulder Pain (Pain in (L) Shoulder blade area x's 2 days)   HPI Todd Sims presents for excruciating L shoulder and left shoulder blade pain of 2 days duration.  The patient started during the night after he was working on his wife's car for the whole day in a quite uncomfortable position.  He rates the pain 10 out of 10.  He took old Percocet prescription that helped some.  Outpatient Medications Prior to Visit  Medication Sig Dispense Refill   fluorouracil (EFUDEX) 5 % cream Apply topically. (Patient not taking: No sig reported)     sertraline (ZOLOFT) 100 MG tablet TAKE 1 TABLET DAILY 90 tablet 3   No facility-administered medications prior to visit.    ROS: Review of Systems  Constitutional:  Negative for appetite change, fatigue and unexpected weight change.  HENT:  Negative for congestion, nosebleeds, sneezing, sore throat and trouble swallowing.   Eyes:  Negative for itching and visual disturbance.  Respiratory:  Negative for cough.   Cardiovascular:  Negative for chest pain, palpitations and leg swelling.  Gastrointestinal:  Negative for abdominal distention, blood in stool, diarrhea and nausea.  Genitourinary:  Negative for frequency and hematuria.  Musculoskeletal:  Positive for neck stiffness. Negative for back pain, gait problem, joint swelling and neck pain.  Skin:  Negative for rash.  Neurological:  Negative for dizziness, tremors, speech difficulty, weakness and numbness.  Psychiatric/Behavioral:  Positive for sleep disturbance. Negative for agitation and dysphoric mood. The patient is not nervous/anxious.    Objective:  BP 130/72 (BP Location: Right Arm)   Pulse 67   Temp 98.5 F (36.9 C) (Oral)   SpO2 97%   BP Readings from Last 3 Encounters:  02/25/21 130/72  12/18/20 (!) 144/96  09/17/20 128/76    Wt Readings from Last 3 Encounters:  12/18/20 183  lb (83 kg)  09/17/20 181 lb (82.1 kg)  04/17/20 179 lb (81.2 kg)    Physical Exam Constitutional:      General: He is in acute distress.     Appearance: He is well-developed. He is not ill-appearing.     Comments: NAD  Eyes:     Conjunctiva/sclera: Conjunctivae normal.     Pupils: Pupils are equal, round, and reactive to light.  Neck:     Thyroid: No thyromegaly.     Vascular: No JVD.  Cardiovascular:     Rate and Rhythm: Normal rate and regular rhythm.     Heart sounds: Normal heart sounds. No murmur heard.   No friction rub. No gallop.  Pulmonary:     Effort: Pulmonary effort is normal. No respiratory distress.     Breath sounds: Normal breath sounds. No wheezing or rales.  Chest:     Chest wall: No tenderness.  Abdominal:     General: Bowel sounds are normal. There is no distension.     Palpations: Abdomen is soft. There is no mass.     Tenderness: There is no abdominal tenderness. There is no guarding or rebound.  Musculoskeletal:        General: Tenderness present. Normal range of motion.     Cervical back: Normal range of motion.  Lymphadenopathy:     Cervical: No cervical adenopathy.  Skin:    General: Skin is warm and dry.     Findings: No rash.  Neurological:     Mental  Status: He is alert and oriented to person, place, and time.     Cranial Nerves: No cranial nerve deficit.     Motor: No abnormal muscle tone.     Coordination: Coordination normal.     Gait: Gait normal.     Deep Tendon Reflexes: Reflexes are normal and symmetric.  Psychiatric:        Behavior: Behavior normal.        Thought Content: Thought content normal.        Judgment: Judgment normal.  He is in some distress due to pain Shoulder joint is nontender.  Trap muscle and rhomboid muscle area are tender to palpation.  Neck is somewhat stiff  Lab Results  Component Value Date   WBC 4.4 09/17/2020   HGB 15.1 09/17/2020   HCT 43.1 09/17/2020   PLT 138.0 (L) 09/17/2020   GLUCOSE 85  09/17/2020   CHOL 218 (H) 09/17/2020   TRIG 143.0 09/17/2020   HDL 42.50 09/17/2020   LDLDIRECT 137.4 08/04/2011   LDLCALC 147 (H) 09/17/2020   ALT 36 09/17/2020   AST 41 (H) 09/17/2020   NA 139 09/17/2020   K 4.6 09/17/2020   CL 104 09/17/2020   CREATININE 0.99 09/17/2020   BUN 14 09/17/2020   CO2 23 09/17/2020   TSH 1.65 09/17/2020   PSA 0.82 09/17/2020   INR 1.0 10/27/2019    EEG  Result Date: 10/27/2019 Lora Havens, MD     10/27/2019  1:32 PM Patient Name: SHIN LAMOUR MRN: [id number] Epilepsy Attending: Lora Havens Referring Physician/Provider: Etta Quill, PA Date: 10/27/2019 Duration: 24.24 mins Patient history: 55yo M with transient global amnesia. EEG to evaluate for seizure. Level of alertness: Awake, drowsy AEDs during EEG study: None Technical aspects: This EEG study was done with scalp electrodes positioned according to the 10-20 International system of electrode placement. Electrical activity was acquired at a sampling rate of 500Hz  and reviewed with a high frequency filter of 70Hz  and a low frequency filter of 1Hz . EEG data were recorded continuously and digitally stored. Description: The posterior dominant rhythm consists of 10 Hz activity of moderate voltage (25-35 uV) seen predominantly in posterior head regions, symmetric and reactive to eye opening and eye closing. Drowsiness was characterized by attenuation of the posterior background rhythm. Sleep was characterized by vertex waves, sleep spindles (12 to 14 Hz), maximal frontocentral region.  Hyperventilation did not show any EEG change.  Physiology photic driving was seen during photic stimulation.  IMPRESSION: This study is within normal limits. No seizures or epileptiform discharges were seen throughout the recording. Priyanka Barbra Sarks   CT HEAD WO CONTRAST  Result Date: 10/27/2019 CLINICAL DATA:  Altered mental status, unclear cause. Possible stroke. EXAM: CT HEAD WITHOUT CONTRAST TECHNIQUE: Contiguous  axial images were obtained from the base of the skull through the vertex without intravenous contrast. COMPARISON:  No pertinent prior studies available for comparison. FINDINGS: Brain: Cerebral volume is normal. Absence of the septum pellucidum. There is no acute intracranial hemorrhage. No demarcated cortical infarct. No extra-axial fluid collection. No evidence of intracranial mass. No midline shift. Vascular: No definite hyperdense vessel. Atherosclerotic calcifications. Skull: Normal. Negative for fracture or focal lesion. Sinuses/Orbits: Visualized orbits show no acute finding. Mild ethmoid and left maxillary sinus mucosal thickening at the imaged levels. No significant mastoid effusion at the imaged levels IMPRESSION: No CT evidence of acute intracranial abnormality. Incidentally noted absence of the septum pellucidum. Mild ethmoid and left maxillary sinus mucosal thickening Electronically  Signed   By: Kellie Simmering DO   On: 10/27/2019 10:45   MR BRAIN WO CONTRAST  Result Date: 10/27/2019 CLINICAL DATA:  Encephalopathy. Acute onset confusion and slurred speech. EXAM: MRI HEAD WITHOUT CONTRAST TECHNIQUE: Multiplanar, multiecho pulse sequences of the brain and surrounding structures were obtained without intravenous contrast. COMPARISON:  Head CT 10/27/2019 FINDINGS: Brain: There is no evidence of acute infarct, intracranial hemorrhage, mass, midline shift, or extra-axial fluid collection. Cerebral volume is within normal limits for age. Absence of the septum pellucidum is again noted. The brain is normal in signal. Vascular: Major intracranial vascular flow voids are preserved. Skull and upper cervical spine: Unremarkable bone marrow signal. Sinuses/Orbits: Mucous retention cysts in the maxillary sinuses. Trace bilateral mastoid fluid. Unremarkable orbits. Other: None. IMPRESSION: 1. No acute intracranial abnormality. 2. Absent septum pellucidum, otherwise unremarkable appearance of the brain.  Electronically Signed   By: Logan Bores M.D.   On: 10/27/2019 12:04   CT ANGIO HEAD CODE STROKE  Result Date: 10/27/2019 CLINICAL DATA:  Code stroke.  Altered mental status. EXAM: CT ANGIOGRAPHY HEAD AND NECK TECHNIQUE: Multidetector CT imaging of the head and neck was performed using the standard protocol during bolus administration of intravenous contrast. Multiplanar CT image reconstructions and MIPs were obtained to evaluate the vascular anatomy. Carotid stenosis measurements (when applicable) are obtained utilizing NASCET criteria, using the distal internal carotid diameter as the denominator. CONTRAST:  102mL OMNIPAQUE IOHEXOL 350 MG/ML SOLN COMPARISON:  None. FINDINGS: CTA NECK FINDINGS Aortic arch: Standard 3 vessel aortic arch with widely patent arch vessel origins. Right carotid system: Patent and smooth without evidence of stenosis or dissection. Left carotid system: Patent and smooth without evidence of stenosis or dissection. Vertebral arteries: Patent and smooth without evidence of stenosis or dissection. Moderately to strongly dominant left vertebral artery. Skeleton: No acute osseous abnormality or suspicious osseous lesion. Other neck: No evidence of cervical lymphadenopathy or mass. Upper chest: Clear lung apices. Review of the MIP images confirms the above findings CTA HEAD FINDINGS Anterior circulation: The internal carotid arteries are patent from skull base to carotid termini with mild atherosclerotic plaque on the left not resulting in significant stenosis. ACAs and MCAs are patent without evidence of a proximal branch occlusion or significant proximal stenosis. No aneurysm is identified. Posterior circulation: The intracranial vertebral arteries are widely patent to the basilar. A patent left PICA and bilateral SCAs are present. AICAs and a right PICA are not clearly identified. The basilar artery is widely patent. Posterior communicating arteries are diminutive or absent. Both PCAs are  patent without evidence of a significant proximal stenosis. No aneurysm is identified. Venous sinuses: As permitted by contrast timing, patent. Anatomic variants: Hypoplastic right A1. Review of the MIP images confirms the above findings IMPRESSION: 1. Minimal intracranial atherosclerosis without large vessel occlusion or significant proximal stenosis. 2. Widely patent cervical carotid and vertebral arteries. Electronically Signed   By: Logan Bores M.D.   On: 10/27/2019 11:25   CT ANGIO NECK CODE STROKE  Result Date: 10/27/2019 CLINICAL DATA:  Code stroke.  Altered mental status. EXAM: CT ANGIOGRAPHY HEAD AND NECK TECHNIQUE: Multidetector CT imaging of the head and neck was performed using the standard protocol during bolus administration of intravenous contrast. Multiplanar CT image reconstructions and MIPs were obtained to evaluate the vascular anatomy. Carotid stenosis measurements (when applicable) are obtained utilizing NASCET criteria, using the distal internal carotid diameter as the denominator. CONTRAST:  63mL OMNIPAQUE IOHEXOL 350 MG/ML SOLN COMPARISON:  None. FINDINGS:  CTA NECK FINDINGS Aortic arch: Standard 3 vessel aortic arch with widely patent arch vessel origins. Right carotid system: Patent and smooth without evidence of stenosis or dissection. Left carotid system: Patent and smooth without evidence of stenosis or dissection. Vertebral arteries: Patent and smooth without evidence of stenosis or dissection. Moderately to strongly dominant left vertebral artery. Skeleton: No acute osseous abnormality or suspicious osseous lesion. Other neck: No evidence of cervical lymphadenopathy or mass. Upper chest: Clear lung apices. Review of the MIP images confirms the above findings CTA HEAD FINDINGS Anterior circulation: The internal carotid arteries are patent from skull base to carotid termini with mild atherosclerotic plaque on the left not resulting in significant stenosis. ACAs and MCAs are patent  without evidence of a proximal branch occlusion or significant proximal stenosis. No aneurysm is identified. Posterior circulation: The intracranial vertebral arteries are widely patent to the basilar. A patent left PICA and bilateral SCAs are present. AICAs and a right PICA are not clearly identified. The basilar artery is widely patent. Posterior communicating arteries are diminutive or absent. Both PCAs are patent without evidence of a significant proximal stenosis. No aneurysm is identified. Venous sinuses: As permitted by contrast timing, patent. Anatomic variants: Hypoplastic right A1. Review of the MIP images confirms the above findings IMPRESSION: 1. Minimal intracranial atherosclerosis without large vessel occlusion or significant proximal stenosis. 2. Widely patent cervical carotid and vertebral arteries. Electronically Signed   By: Logan Bores M.D.   On: 10/27/2019 11:25    Assessment & Plan:   Problem List Items Addressed This Visit     Cervical radiculopathy due to degenerative joint disease of spine    New severe excruciating pain, likely off radiculitis origin.  Differential diagnoses discussed.  Toradol 60 mg IM given.  He was prescribed Medrol DosePack and Percocet to use as needed.  We discussed risks and benefits of steroids and opioids use short-term.  Rest.  Close follow-up with Dr. Jenny Reichmann      Relevant Medications   oxyCODONE-acetaminophen (PERCOCET) 10-325 MG tablet   methylPREDNISolone (MEDROL DOSEPAK) 4 MG TBPK tablet   Chronic low back pain    No complaints of low back pain at present      Relevant Medications   oxyCODONE-acetaminophen (PERCOCET) 10-325 MG tablet   methylPREDNISolone (MEDROL DOSEPAK) 4 MG TBPK tablet      Meds ordered this encounter  Medications   oxyCODONE-acetaminophen (PERCOCET) 10-325 MG tablet    Sig: Take 0.5-1 tablets by mouth every 6 (six) hours as needed for up to 5 days for pain.    Dispense:  20 tablet    Refill:  0    methylPREDNISolone (MEDROL DOSEPAK) 4 MG TBPK tablet    Sig: As directed    Dispense:  21 tablet    Refill:  0   ketorolac (TORADOL) injection 60 mg      Follow-up: Return for a follow-up visit.  Walker Kehr, MD

## 2021-03-02 NOTE — Assessment & Plan Note (Signed)
New severe excruciating pain, likely off radiculitis origin.  Differential diagnoses discussed.  Toradol 60 mg IM given.  He was prescribed Medrol DosePack and Percocet to use as needed.  We discussed risks and benefits of steroids and opioids use short-term.  Rest.  Close follow-up with Dr. Jenny Reichmann

## 2021-03-02 NOTE — Assessment & Plan Note (Signed)
No complaints of low back pain at present

## 2021-03-09 ENCOUNTER — Encounter: Payer: Self-pay | Admitting: Internal Medicine

## 2021-05-12 DIAGNOSIS — H903 Sensorineural hearing loss, bilateral: Secondary | ICD-10-CM | POA: Diagnosis not present

## 2021-05-12 DIAGNOSIS — H9313 Tinnitus, bilateral: Secondary | ICD-10-CM | POA: Diagnosis not present

## 2021-07-09 DIAGNOSIS — H93299 Other abnormal auditory perceptions, unspecified ear: Secondary | ICD-10-CM | POA: Diagnosis not present

## 2021-09-03 IMAGING — CT CT HEAD W/O CM
4 of 8 series · 16 of 47 positions shown, 17 images · non-contrast
Comparison: No pertinent prior studies available for comparison.

CLINICAL DATA: Altered mental status, unclear cause. Possible
stroke.

EXAM:
CT HEAD WITHOUT CONTRAST
TECHNIQUE: Contiguous axial images were obtained from the base of the skull
through the vertex without intravenous contrast.

[Series 3: head without · axial · non-contrast · 0.39mm/px · z∈[-70,-10]mm · 3 of 25 slices shown, 4 images]
[im 7/25  brain]
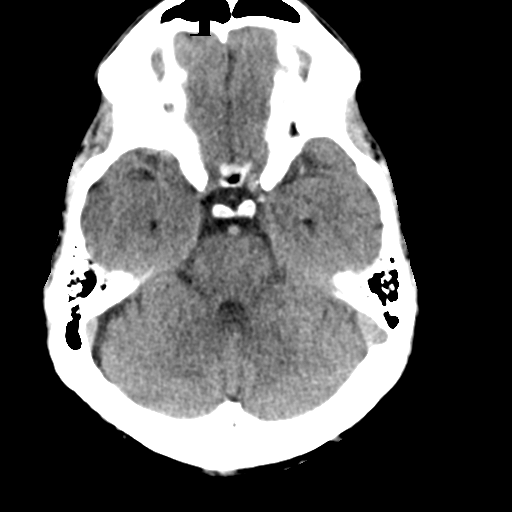
[im 7/25  bone]
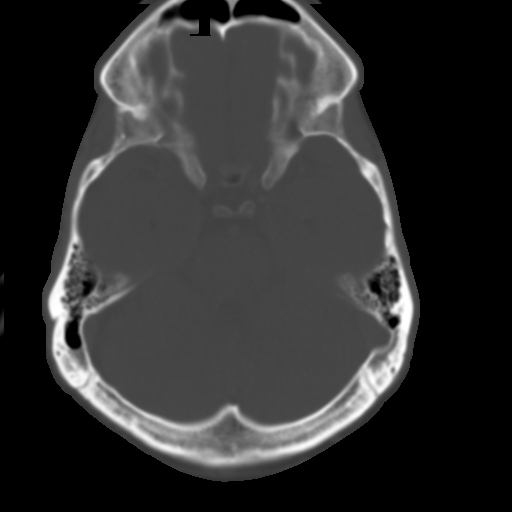
[im 13/25  brain]
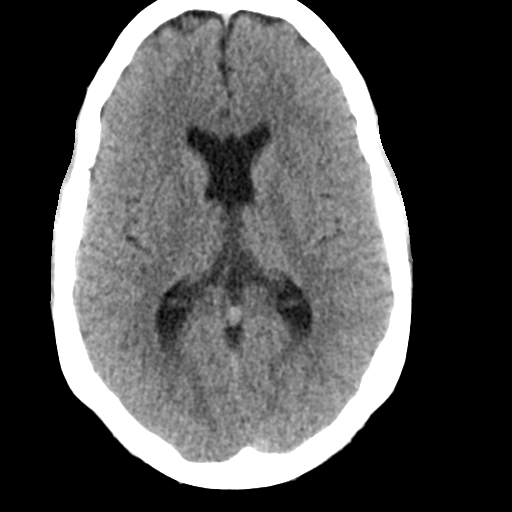
[im 19/25  brain]
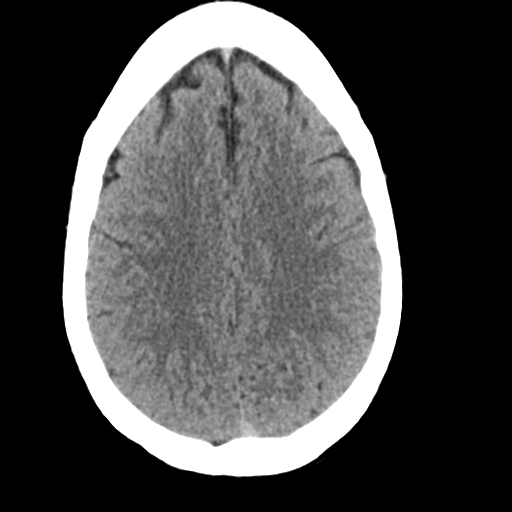

[Series 4: head bone · axial · 0.39mm/px · z∈[-92,+10]mm · 8 of 61 slices shown]
[im 5/61  bone]
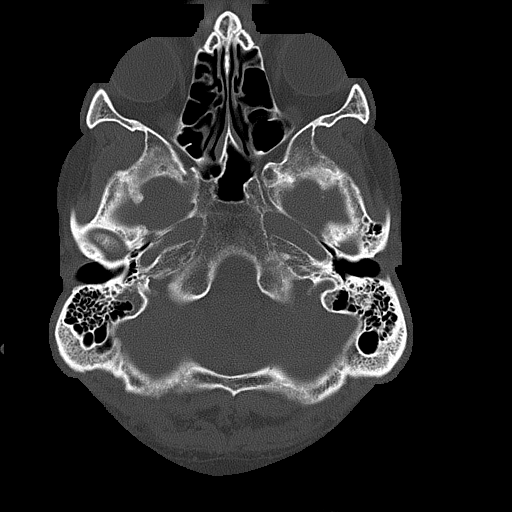
[im 14/61  bone]
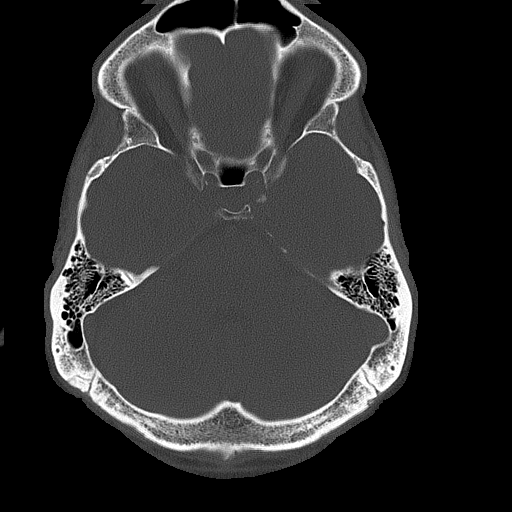
[im 19/61  bone]
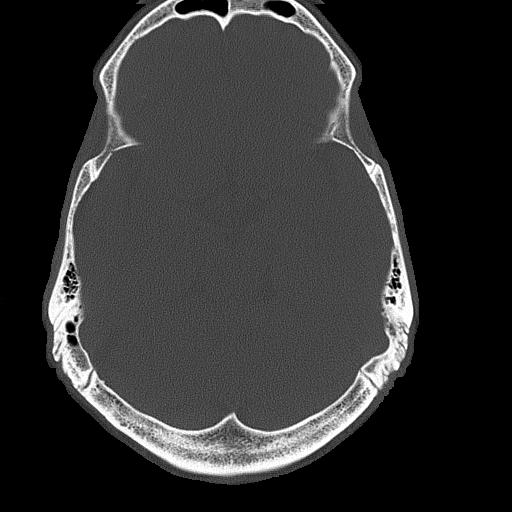
[im 28/61  bone]
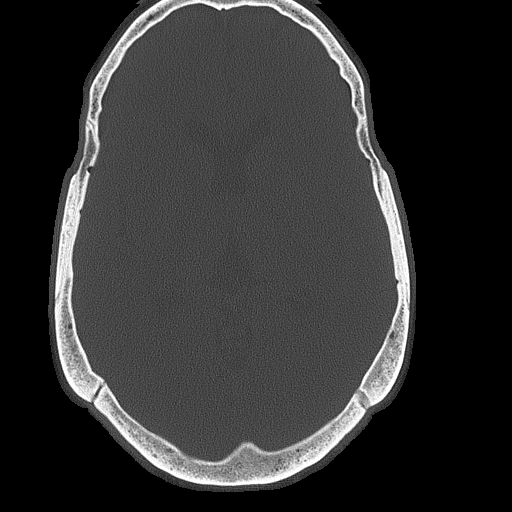
[im 33/61  bone]
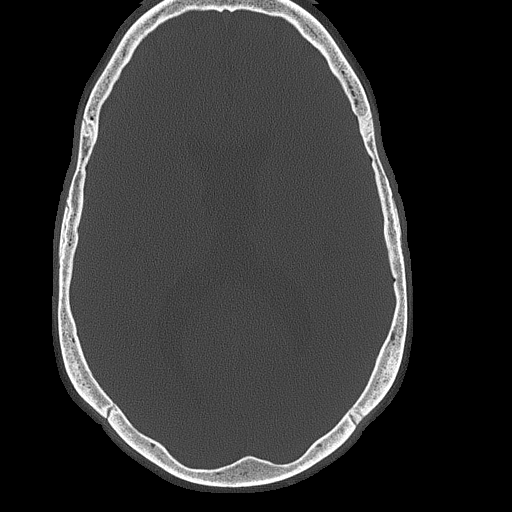
[im 42/61  bone]
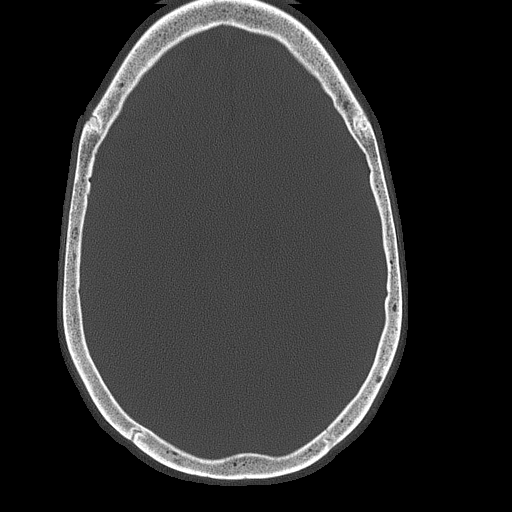
[im 47/61  bone]
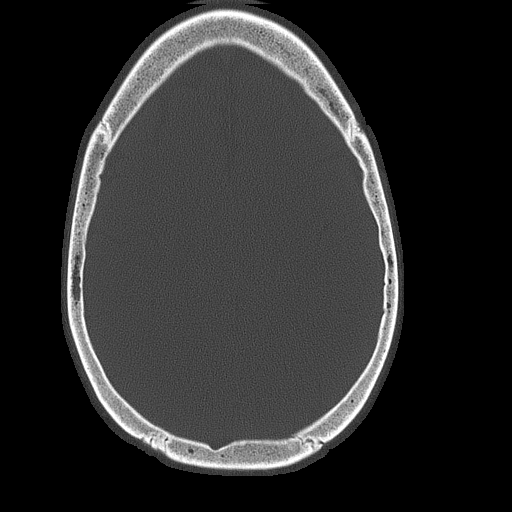
[im 56/61  bone]
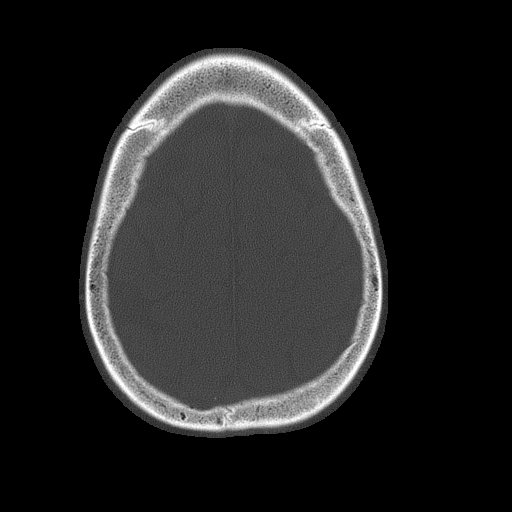

[Series 5: head without cor · coronal · non-contrast · 0.27mm/px · 3 of 71 slices shown]
[im 18/71  brain]
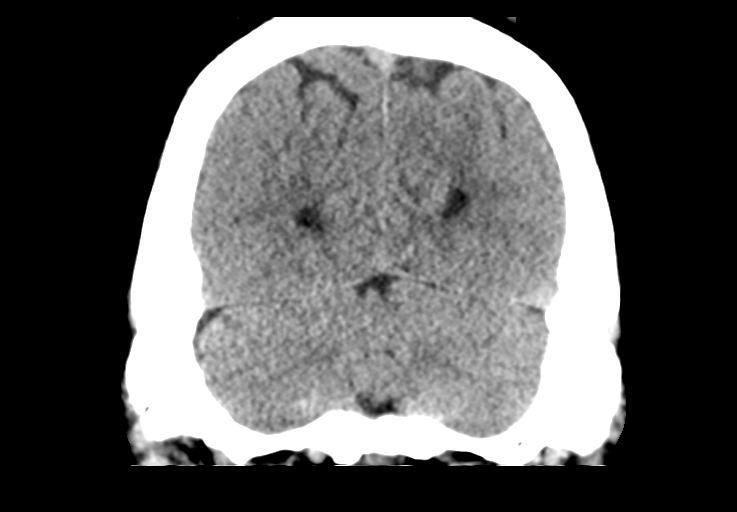
[im 36/71  brain]
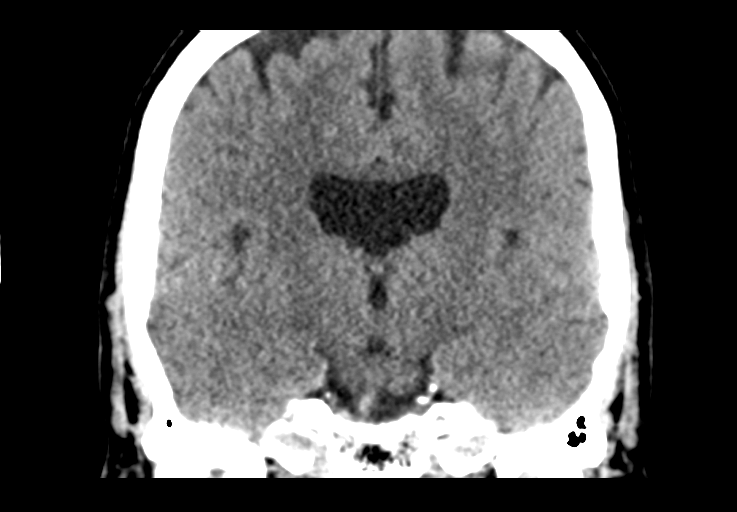
[im 53/71  brain]
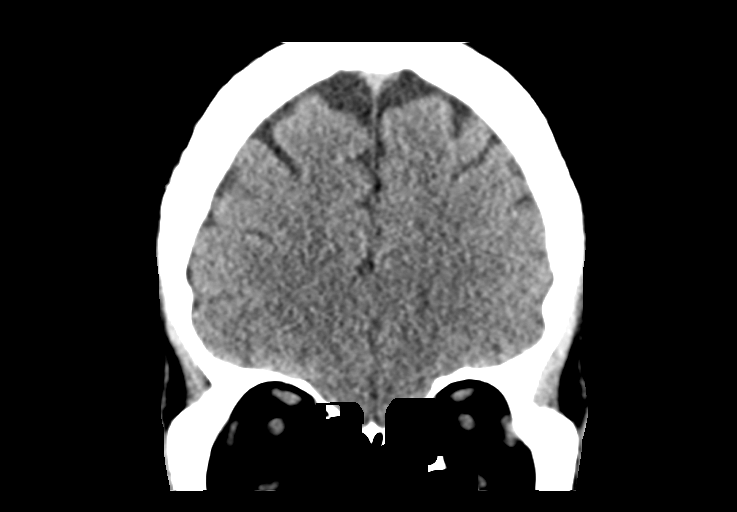

[Series 6: head without sag · sagittal · non-contrast · 0.30mm/px · 2 of 67 slices shown]
[im 23/67  brain]
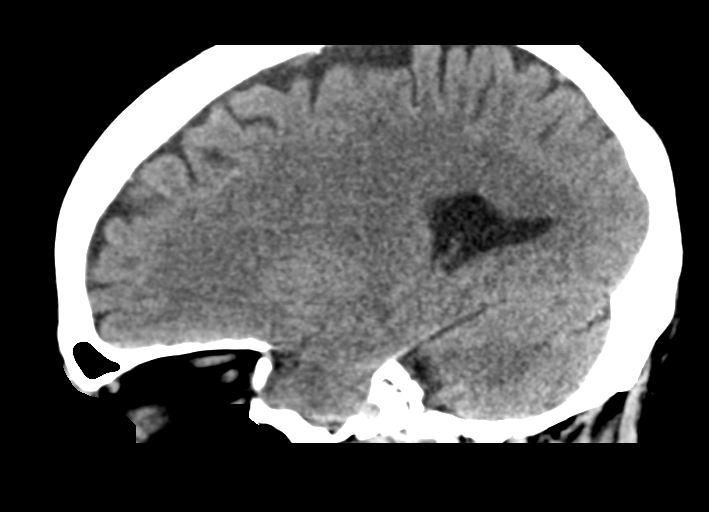
[im 45/67  brain]
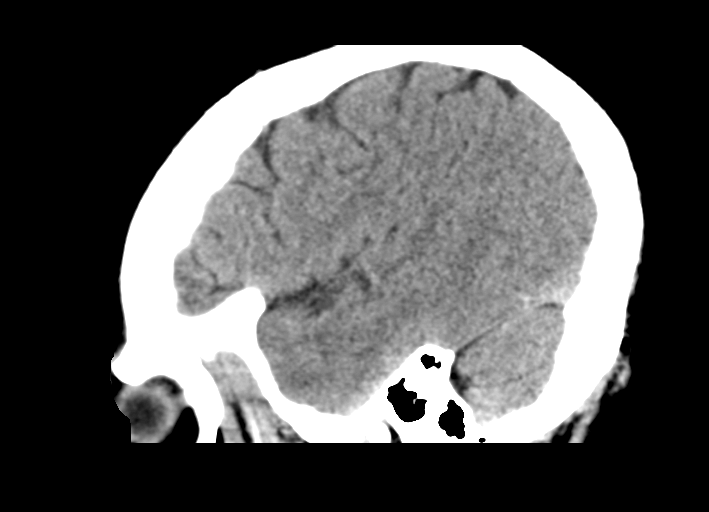

[16 of 47 positions shown; findings below may reference images not displayed]

FINDINGS: Brain:

Cerebral volume is normal.

Absence of the septum pellucidum.

There is no acute intracranial hemorrhage.

No demarcated cortical infarct.

No extra-axial fluid collection.

No evidence of intracranial mass.

No midline shift.

Vascular: No definite hyperdense vessel. Atherosclerotic
calcifications.

Skull: Normal. Negative for fracture or focal lesion.

Sinuses/Orbits: Visualized orbits show no acute finding. Mild
ethmoid and left maxillary sinus mucosal thickening at the imaged
levels. No significant mastoid effusion at the imaged levels
IMPRESSION: No CT evidence of acute intracranial abnormality.

Incidentally noted absence of the septum pellucidum.

Mild ethmoid and left maxillary sinus mucosal thickening

## 2021-11-28 ENCOUNTER — Encounter: Payer: Self-pay | Admitting: Internal Medicine

## 2021-11-29 ENCOUNTER — Encounter: Payer: Self-pay | Admitting: Internal Medicine

## 2021-12-03 ENCOUNTER — Ambulatory Visit: Payer: BC Managed Care – PPO | Admitting: Internal Medicine

## 2021-12-03 ENCOUNTER — Ambulatory Visit (INDEPENDENT_AMBULATORY_CARE_PROVIDER_SITE_OTHER): Payer: BC Managed Care – PPO | Admitting: Internal Medicine

## 2021-12-03 VITALS — BP 126/80 | HR 62 | Temp 99.1°F | Ht 71.0 in | Wt 180.0 lb

## 2021-12-03 DIAGNOSIS — R52 Pain, unspecified: Secondary | ICD-10-CM | POA: Diagnosis not present

## 2021-12-03 DIAGNOSIS — R030 Elevated blood-pressure reading, without diagnosis of hypertension: Secondary | ICD-10-CM

## 2021-12-03 DIAGNOSIS — E559 Vitamin D deficiency, unspecified: Secondary | ICD-10-CM | POA: Diagnosis not present

## 2021-12-03 DIAGNOSIS — L905 Scar conditions and fibrosis of skin: Secondary | ICD-10-CM

## 2021-12-03 DIAGNOSIS — E7800 Pure hypercholesterolemia, unspecified: Secondary | ICD-10-CM

## 2021-12-03 DIAGNOSIS — Z125 Encounter for screening for malignant neoplasm of prostate: Secondary | ICD-10-CM

## 2021-12-03 DIAGNOSIS — Z0001 Encounter for general adult medical examination with abnormal findings: Secondary | ICD-10-CM

## 2021-12-03 DIAGNOSIS — Z86010 Personal history of colonic polyps: Secondary | ICD-10-CM

## 2021-12-03 DIAGNOSIS — E538 Deficiency of other specified B group vitamins: Secondary | ICD-10-CM | POA: Diagnosis not present

## 2021-12-03 LAB — URINALYSIS, ROUTINE W REFLEX MICROSCOPIC
Bilirubin Urine: NEGATIVE
Hgb urine dipstick: NEGATIVE
Ketones, ur: NEGATIVE
Leukocytes,Ua: NEGATIVE
Nitrite: NEGATIVE
RBC / HPF: NONE SEEN (ref 0–?)
Specific Gravity, Urine: 1.025 (ref 1.000–1.030)
Total Protein, Urine: NEGATIVE
Urine Glucose: NEGATIVE
Urobilinogen, UA: 0.2 (ref 0.0–1.0)
pH: 6 (ref 5.0–8.0)

## 2021-12-03 LAB — CBC WITH DIFFERENTIAL/PLATELET
Basophils Absolute: 0 10*3/uL (ref 0.0–0.1)
Basophils Relative: 0.8 % (ref 0.0–3.0)
Eosinophils Absolute: 0.1 10*3/uL (ref 0.0–0.7)
Eosinophils Relative: 2 % (ref 0.0–5.0)
HCT: 42.6 % (ref 39.0–52.0)
Hemoglobin: 15 g/dL (ref 13.0–17.0)
Lymphocytes Relative: 28.8 % (ref 12.0–46.0)
Lymphs Abs: 1.7 10*3/uL (ref 0.7–4.0)
MCHC: 35.1 g/dL (ref 30.0–36.0)
MCV: 95.9 fl (ref 78.0–100.0)
Monocytes Absolute: 0.4 10*3/uL (ref 0.1–1.0)
Monocytes Relative: 7.8 % (ref 3.0–12.0)
Neutro Abs: 3.5 10*3/uL (ref 1.4–7.7)
Neutrophils Relative %: 60.6 % (ref 43.0–77.0)
Platelets: 151 10*3/uL (ref 150.0–400.0)
RBC: 4.45 Mil/uL (ref 4.22–5.81)
RDW: 13.4 % (ref 11.5–15.5)
WBC: 5.7 10*3/uL (ref 4.0–10.5)

## 2021-12-03 LAB — BASIC METABOLIC PANEL
BUN: 16 mg/dL (ref 6–23)
CO2: 27 mEq/L (ref 19–32)
Calcium: 9.4 mg/dL (ref 8.4–10.5)
Chloride: 104 mEq/L (ref 96–112)
Creatinine, Ser: 0.91 mg/dL (ref 0.40–1.50)
GFR: 95.23 mL/min (ref >60.00)
Glucose, Bld: 84 mg/dL (ref 70–99)
Potassium: 4.7 mEq/L (ref 3.5–5.1)
Sodium: 137 mEq/L (ref 135–145)

## 2021-12-03 LAB — HEPATIC FUNCTION PANEL
ALT: 41 U/L (ref 0–53)
AST: 54 U/L — ABNORMAL HIGH (ref 0–37)
Albumin: 4.4 g/dL (ref 3.5–5.2)
Alkaline Phosphatase: 67 U/L (ref 39–117)
Bilirubin, Direct: 0.1 mg/dL (ref 0.0–0.3)
Total Bilirubin: 0.5 mg/dL (ref 0.2–1.2)
Total Protein: 6.9 g/dL (ref 6.0–8.3)

## 2021-12-03 LAB — VITAMIN D 25 HYDROXY (VIT D DEFICIENCY, FRACTURES): VITD: 21.17 ng/mL — ABNORMAL LOW (ref 30.00–100.00)

## 2021-12-03 LAB — LIPID PANEL
Cholesterol: 203 mg/dL — ABNORMAL HIGH (ref 0–200)
HDL: 36.2 mg/dL — ABNORMAL LOW (ref >39.00)
NonHDL: 166.7
Total CHOL/HDL Ratio: 6
Triglycerides: 328 mg/dL — ABNORMAL HIGH (ref 0.0–149.0)
VLDL: 65.6 mg/dL — ABNORMAL HIGH (ref 0.0–40.0)

## 2021-12-03 LAB — VITAMIN B12: Vitamin B-12: 242 pg/mL (ref 211–911)

## 2021-12-03 LAB — PSA: PSA: 1.23 ng/mL (ref 0.10–4.00)

## 2021-12-03 LAB — TSH: TSH: 1.44 u[IU]/mL (ref 0.35–5.50)

## 2021-12-03 LAB — LDL CHOLESTEROL, DIRECT: Direct LDL: 122 mg/dL

## 2021-12-03 MED ORDER — TRIAMCINOLONE ACETONIDE 0.1 % EX CREA: 1.0000 | TOPICAL_CREAM | Freq: Two times a day (BID) | CUTANEOUS | Status: DC

## 2021-12-03 NOTE — Progress Notes (Signed)
Patient ID: Todd Sims, male   DOB: [birth year], 56 y.o.   MRN: [id number]         Chief Complaint:: wellness exam and scar irritation, hld, low vit d, BP       HPI:  Todd Sims is a 56 y.o. male here for wellness exam; due for colonoscopy, decliens tdap, o/w up to date                        Also has hx of surgury remotely for thoracic outlet syndrome, but has had increased irriation and discomfort at the scar mild intermittent lately, without fever, swelling or drainage.  Pt denies chest pain, increased sob or doe, wheezing, orthopnea, PND, increased LE swelling, palpitations, dizziness or syncope.   Pt denies polydipsia, polyuria, or new focal neuro s/s.    Pt denies fever, wt loss, night sweats, loss of appetite, or other constitutional symptoms  Does not want statin for hld for now.  Not taking Vit D   Wt Readings from Last 3 Encounters:  12/03/21 180 lb (81.6 kg)  12/18/20 183 lb (83 kg)  09/17/20 181 lb (82.1 kg)   BP Readings from Last 3 Encounters:  12/03/21 126/80  02/25/21 130/72  12/18/20 (!) 144/96   Immunization History  Administered Date(s) Administered   Tdap 08/07/2011   Zoster Recombinat (Shingrix) 12/18/2020, 02/13/2021  There are no preventive care reminders to display for this patient.    Past Medical History:  Diagnosis Date   Allergic rhinitis    ALLERGIC RHINITIS 08/26/2007   Allergy    Anxiety 08/06/2010   Cervical neck pain with evidence of disc disease 08/06/2010   Chronic prostatitis 08/07/2011   Degenerative disk disease    lumbar and C- spine   Depression    GERD 04/18/2010   GERD (gastroesophageal reflux disease)    Hyperlipemia    HYPERLIPIDEMIA 08/26/2007   Neck pain    Premature ejaculation 08/06/2010   Shoulder pain, right    Thoracic outlet syndrome 08/07/2011   Past Surgical History:  Procedure Laterality Date   THORACIC OUTLET SURGERY Right 2014    reports that he has never smoked. He has never used smokeless tobacco. He reports  current alcohol use of about 2.0 standard drinks of alcohol per week. He reports that he does not use drugs. family history includes Colon polyps in his father; Lung cancer in his mother. No Known Allergies Current Outpatient Medications on File Prior to Visit  Medication Sig Dispense Refill   sertraline (ZOLOFT) 100 MG tablet TAKE 1 TABLET DAILY 90 tablet 3   No current facility-administered medications on file prior to visit.        ROS:  All others reviewed and negative.  Objective        PE:  BP 126/80 (BP Location: Right Arm, Patient Position: Sitting, Cuff Size: Large)   Pulse 62   Temp 99.1 F (37.3 C) (Oral)   Ht '5\' 11"'$  (1.803 m)   Wt 180 lb (81.6 kg)   SpO2 94%   BMI 25.10 kg/m                 Constitutional: Pt appears in NAD               HENT: Head: NCAT.                Right Ear: External ear normal.  Left Ear: External ear normal.                Eyes: . Pupils are equal, round, and reactive to light. Conjunctivae and EOM are normal               Nose: without d/c or deformity               Neck: Neck supple. Gross normal ROM               Cardiovascular: Normal rate and regular rhythm.                 Pulmonary/Chest: Effort normal and breath sounds without rales or wheezing.                Abd:  Soft, NT, ND, + BS, no organomegaly               Neurological: Pt is alert. At baseline orientation, motor grossly intact               Skin:  LE edema - none, right upper chest wall with 2 cm scar with tender irritation without swelling or drainage               Psychiatric: Pt behavior is normal without agitation   Micro: none  Cardiac tracings I have personally interpreted today:  none  Pertinent Radiological findings (summarize): none   Lab Results  Component Value Date   WBC 5.7 12/03/2021   HGB 15.0 12/03/2021   HCT 42.6 12/03/2021   PLT 151.0 12/03/2021   GLUCOSE 84 12/03/2021   CHOL 203 (H) 12/03/2021   TRIG 328.0 (H) 12/03/2021   HDL  36.20 (L) 12/03/2021   LDLDIRECT 122.0 12/03/2021   LDLCALC 147 (H) 09/17/2020   ALT 41 12/03/2021   AST 54 (H) 12/03/2021   NA 137 12/03/2021   K 4.7 12/03/2021   CL 104 12/03/2021   CREATININE 0.91 12/03/2021   BUN 16 12/03/2021   CO2 27 12/03/2021   TSH 1.44 12/03/2021   PSA 1.23 12/03/2021   INR 1.0 10/27/2019   Assessment/Plan:  Todd Sims is a 56 y.o. White or Caucasian [1] male with  has a past medical history of Allergic rhinitis, ALLERGIC RHINITIS (08/26/2007), Allergy, Anxiety (08/06/2010), Cervical neck pain with evidence of disc disease (08/06/2010), Chronic prostatitis (08/07/2011), Degenerative disk disease, Depression, GERD (04/18/2010), GERD (gastroesophageal reflux disease), Hyperlipemia, HYPERLIPIDEMIA (08/26/2007), Neck pain, Premature ejaculation (08/06/2010), Shoulder pain, right, and Thoracic outlet syndrome (08/07/2011).  Vitamin D deficiency Last vitamin D Lab Results  Component Value Date   VD25OH 21.66 (L) 09/17/2020   Low, to start oral replacement   Encounter for well adult exam with abnormal findings Age and sex appropriate education and counseling updated with regular exercise and diet Referrals for preventative services - for colonoscopy as is due Immunizations addressed - declines tdap Smoking counseling  - none needed Evidence for depression or other mood disorder - none significant Most recent labs reviewed. I have personally reviewed and have noted: 1) the patient's medical and social history 2) The patient's current medications and supplements 3) The patient's height, weight, and BMI have been recorded in the chart   Blood pressure elevated without history of HTN Stable, not elevated since previous  BP Readings from Last 3 Encounters:  12/03/21 126/80  02/25/21 130/72  12/18/20 (!) 144/96    Hyperlipidemia Lab Results  Component Value Date   LDLCALC 147 (H) 09/17/2020  uncontrolled, for lower chol diet, pt declines statin for  now  Pain in surgical scar Mild for triam cr prn Followup: Return in about 1 year (around 12/04/2022).  Cathlean Cower, MD 12/07/2021 10:37 AM [facility]

## 2021-12-03 NOTE — Patient Instructions (Addendum)
You will be contacted regarding the referral for: colonoscopy  Please take OTC Vitamin D3 at 2000 units per day, indefinitely  Please continue all other medications as before, and refills have been done if requested.  Please have the pharmacy call with any other refills you may need.  Please continue your efforts at being more active, low cholesterol diet, and weight control.  You are otherwise up to date with prevention measures today.  Please keep your appointments with your specialists as you may have planned  Please call if you would want the $99 Cardiac Ct score testing  Please go to the LAB at the blood drawing area for the tests to be done  You will be contacted by phone if any changes need to be made immediately.  Otherwise, you will receive a letter about your results with an explanation, but please check with MyChart first.  Please remember to sign up for MyChart if you have not done so, as this will be important to you in the future with finding out test results, communicating by private email, and scheduling acute appointments online when needed.  Please make an Appointment to return for your 1 year visit, or sooner if needed

## 2021-12-03 NOTE — Assessment & Plan Note (Signed)
Last vitamin D Lab Results  Component Value Date   VD25OH 21.66 (L) 09/17/2020   Low, to start oral replacement

## 2021-12-07 ENCOUNTER — Encounter: Payer: Self-pay | Admitting: Internal Medicine

## 2021-12-07 DIAGNOSIS — L905 Scar conditions and fibrosis of skin: Secondary | ICD-10-CM | POA: Insufficient documentation

## 2021-12-07 NOTE — Assessment & Plan Note (Signed)
Lab Results  Component Value Date   LDLCALC 147 (H) 09/17/2020   uncontrolled, for lower chol diet, pt declines statin for now

## 2021-12-07 NOTE — Assessment & Plan Note (Signed)
Age and sex appropriate education and counseling updated with regular exercise and diet Referrals for preventative services - for colonoscopy as is due Immunizations addressed - declines tdap Smoking counseling  - none needed Evidence for depression or other mood disorder - none significant Most recent labs reviewed. I have personally reviewed and have noted: 1) the patient's medical and social history 2) The patient's current medications and supplements 3) The patient's height, weight, and BMI have been recorded in the chart

## 2021-12-07 NOTE — Assessment & Plan Note (Signed)
Stable, not elevated since previous  BP Readings from Last 3 Encounters:  12/03/21 126/80  02/25/21 130/72  12/18/20 (!) 144/96

## 2021-12-07 NOTE — Assessment & Plan Note (Signed)
Mild for triam cr prn

## 2021-12-23 DIAGNOSIS — D485 Neoplasm of uncertain behavior of skin: Secondary | ICD-10-CM | POA: Diagnosis not present

## 2021-12-23 DIAGNOSIS — D225 Melanocytic nevi of trunk: Secondary | ICD-10-CM | POA: Diagnosis not present

## 2021-12-23 DIAGNOSIS — Z85828 Personal history of other malignant neoplasm of skin: Secondary | ICD-10-CM | POA: Diagnosis not present

## 2021-12-23 DIAGNOSIS — L910 Hypertrophic scar: Secondary | ICD-10-CM | POA: Diagnosis not present

## 2021-12-23 DIAGNOSIS — L821 Other seborrheic keratosis: Secondary | ICD-10-CM | POA: Diagnosis not present

## 2021-12-23 DIAGNOSIS — C44612 Basal cell carcinoma of skin of right upper limb, including shoulder: Secondary | ICD-10-CM | POA: Diagnosis not present

## 2021-12-23 DIAGNOSIS — C44319 Basal cell carcinoma of skin of other parts of face: Secondary | ICD-10-CM | POA: Diagnosis not present

## 2021-12-23 DIAGNOSIS — C4441 Basal cell carcinoma of skin of scalp and neck: Secondary | ICD-10-CM | POA: Diagnosis not present

## 2021-12-23 DIAGNOSIS — L570 Actinic keratosis: Secondary | ICD-10-CM | POA: Diagnosis not present

## 2021-12-24 ENCOUNTER — Encounter: Payer: Self-pay | Admitting: Internal Medicine

## 2022-01-22 ENCOUNTER — Encounter: Payer: Self-pay | Admitting: Gastroenterology

## 2022-01-26 DIAGNOSIS — C44319 Basal cell carcinoma of skin of other parts of face: Secondary | ICD-10-CM | POA: Diagnosis not present

## 2022-01-29 ENCOUNTER — Other Ambulatory Visit: Payer: Self-pay | Admitting: Internal Medicine

## 2022-02-26 ENCOUNTER — Other Ambulatory Visit: Payer: Self-pay

## 2022-02-26 ENCOUNTER — Ambulatory Visit (AMBULATORY_SURGERY_CENTER): Payer: Self-pay | Admitting: *Deleted

## 2022-02-26 VITALS — Ht 71.0 in | Wt 185.0 lb

## 2022-02-26 DIAGNOSIS — Z86010 Personal history of colonic polyps: Secondary | ICD-10-CM

## 2022-02-26 MED ORDER — NA SULFATE-K SULFATE-MG SULF 17.5-3.13-1.6 GM/177ML PO SOLN: 1.0000 | Freq: Once | ORAL | 0 refills | Status: AC

## 2022-02-26 NOTE — Progress Notes (Signed)
Pre visit completed in person.  No egg or soy allergy known to patient  No issues known to pt with past sedation with any surgeries or procedures Patient denies ever being told they had issues or difficulty with intubation  No FH of Malignant Hyperthermia Pt is not on diet pills Pt is not on  home 02  Pt is not on blood thinners  Pt denies issues with constipation  No A fib or A flutter  Pt instructed to use Singlecare.com or GoodRx for a price reduction on prep   

## 2022-03-16 ENCOUNTER — Encounter: Payer: BC Managed Care – PPO | Admitting: Gastroenterology

## 2022-04-21 ENCOUNTER — Encounter: Payer: Self-pay | Admitting: Gastroenterology

## 2022-04-23 ENCOUNTER — Ambulatory Visit (AMBULATORY_SURGERY_CENTER): Payer: BC Managed Care – PPO | Admitting: Gastroenterology

## 2022-04-23 ENCOUNTER — Encounter: Payer: Self-pay | Admitting: Gastroenterology

## 2022-04-23 VITALS — BP 123/82 | HR 61 | Temp 96.8°F | Resp 10 | Ht 71.0 in | Wt 180.0 lb

## 2022-04-23 DIAGNOSIS — Z1211 Encounter for screening for malignant neoplasm of colon: Secondary | ICD-10-CM | POA: Diagnosis not present

## 2022-04-23 DIAGNOSIS — Z86010 Personal history of colonic polyps: Secondary | ICD-10-CM

## 2022-04-23 DIAGNOSIS — K388 Other specified diseases of appendix: Secondary | ICD-10-CM | POA: Diagnosis not present

## 2022-04-23 DIAGNOSIS — D120 Benign neoplasm of cecum: Secondary | ICD-10-CM

## 2022-04-23 DIAGNOSIS — K635 Polyp of colon: Secondary | ICD-10-CM | POA: Diagnosis not present

## 2022-04-23 DIAGNOSIS — Z09 Encounter for follow-up examination after completed treatment for conditions other than malignant neoplasm: Secondary | ICD-10-CM | POA: Diagnosis not present

## 2022-04-23 MED ORDER — SODIUM CHLORIDE 0.9 % IV SOLN: 500.0000 mL | INTRAVENOUS | Status: DC

## 2022-04-23 NOTE — Op Note (Signed)
[facility] Patient Name: Rhonda Vangieson Procedure Date: 04/23/2022 8:53 AM MRN: [id number] Endoscopist: Ladene Artist , MD, [id number] Age: 56 Referring MD:  Date of Birth: [birth year] Gender: Male Account #: [id number] Procedure:                Colonoscopy Indications:              Surveillance: Personal history of adenomatous and                            sessile serrated polyps on last colonoscopy 5 years                            ago Medicines:                Monitored Anesthesia Care Procedure:                Pre-Anesthesia Assessment:                           - Prior to the procedure, a History and Physical                            was performed, and patient medications and                            allergies were reviewed. The patient's tolerance of                            previous anesthesia was also reviewed. The risks                            and benefits of the procedure and the sedation                            options and risks were discussed with the patient.                            All questions were answered, and informed consent                            was obtained. Prior Anticoagulants: The patient has                            taken no anticoagulant or antiplatelet agents. ASA                            Grade Assessment: II - A patient with mild systemic                            disease. After reviewing the risks and benefits,                            the patient was deemed in satisfactory condition to  undergo the procedure.                           After obtaining informed consent, the colonoscope                            was passed under direct vision. Throughout the                            procedure, the patient's blood pressure, pulse, and                            oxygen saturations were monitored continuously. The                            CF [id number] #[id number] was introduced through the anus                             and advanced to the the cecum, identified by                            appendiceal orifice and ileocecal valve. The                            ileocecal valve, appendiceal orifice, and rectum                            were photographed. The quality of the bowel                            preparation was good. The colonoscopy was performed                            without difficulty. The patient tolerated the                            procedure well. Scope In: 9:04:20 AM Scope Out: 9:16:53 AM Scope Withdrawal Time: 0 hours 10 minutes 3 seconds  Total Procedure Duration: 0 hours 12 minutes 33 seconds  Findings:                 The perianal and digital rectal examinations were                            normal.                           A 6 mm polyp was found in the appendiceal orifice.                            The polyp was sessile. The polyp was removed with a                            cold snare. Resection and retrieval were complete.  Internal hemorrhoids were found during                            retroflexion. The hemorrhoids were small and Grade                            I (internal hemorrhoids that do not prolapse).                           The exam was otherwise without abnormality on                            direct and retroflexion views. Complications:            No immediate complications. Estimated blood loss:                            None. Estimated Blood Loss:     Estimated blood loss: none. Impression:               - One 6 mm polyp at the appendiceal orifice,                            removed with a cold snare. Resected and retrieved.                           - Internal hemorrhoids.                           - The examination was otherwise normal on direct                            and retroflexion views. Recommendation:           - Repeat colonoscopy after studies are complete for                             surveillance based on pathology results.                           - Patient has a contact number available for                            emergencies. The signs and symptoms of potential                            delayed complications were discussed with the                            patient. Return to normal activities tomorrow.                            Written discharge instructions were provided to the                            patient.                           -  Resume previous diet.                           - Continue present medications.                           - Await pathology results. Ladene Artist, MD 04/23/2022 9:19:47 AM This report has been signed electronically.

## 2022-04-23 NOTE — Progress Notes (Signed)
Patient reports no changes to health or medications since pre visit. 

## 2022-04-23 NOTE — Progress Notes (Signed)
Called to room to assist during endoscopic procedure.  Patient ID and intended procedure confirmed with present staff. Received instructions for my participation in the procedure from the performing physician.  

## 2022-04-23 NOTE — Progress Notes (Signed)
Sedate, gd SR, tolerated procedure well, VSS, report to RN 

## 2022-04-23 NOTE — Progress Notes (Signed)
History & Physical  Primary Care Physician:  Biagio Borg, MD Primary Gastroenterologist: Lucio Edward, MD  CHIEF COMPLAINT: Personal history of colon polyps   HPI: Todd Sims is a 57 y.o. male with a personal history of adenomatous and sessile serrated colon polyps for surveillance colonoscopy.   Past Medical History:  Diagnosis Date   Allergic rhinitis    ALLERGIC RHINITIS 08/26/2007   Allergy    Anxiety 08/06/2010   Cervical neck pain with evidence of disc disease 08/06/2010   Chronic prostatitis 08/07/2011   Degenerative disk disease    lumbar and C- spine   Depression    GERD 04/18/2010   GERD (gastroesophageal reflux disease)    Hyperlipemia    HYPERLIPIDEMIA 08/26/2007   Neck pain    Premature ejaculation 08/06/2010   Shoulder pain, right    Thoracic outlet syndrome 08/07/2011    Past Surgical History:  Procedure Laterality Date   COLONOSCOPY     THORACIC OUTLET SURGERY Right 2014    Prior to Admission medications   Medication Sig Start Date End Date Taking? Authorizing Provider  sertraline (ZOLOFT) 100 MG tablet TAKE 1 TABLET DAILY 01/29/22  Yes Biagio Borg, MD  gabapentin (NEURONTIN) 300 MG capsule Take 1 capsule every day by oral route at bedtime for 30 days. 04/01/22   [provider]    Current Outpatient Medications  Medication Sig Dispense Refill   sertraline (ZOLOFT) 100 MG tablet TAKE 1 TABLET DAILY 90 tablet 3   gabapentin (NEURONTIN) 300 MG capsule Take 1 capsule every day by oral route at bedtime for 30 days.     Current Facility-Administered Medications  Medication Dose Route Frequency Provider Last Rate Last Admin   0.9 %  sodium chloride infusion  500 mL Intravenous Continuous Ladene Artist, MD        Allergies as of 04/23/2022   (No Known Allergies)    Family History  Problem Relation Age of Onset   Lung cancer Mother    Colon polyps Father    Colon cancer Neg Hx    Esophageal cancer Neg Hx    Liver cancer Neg Hx     Pancreatic cancer Neg Hx    Rectal cancer Neg Hx    Stomach cancer Neg Hx     Social History   Socioeconomic History   Marital status: Married    Spouse name: Not on file   Number of children: Not on file   Years of education: Not on file   Highest education level: Not on file  Occupational History    Comment: Metallurgist  Tobacco Use   Smoking status: Never   Smokeless tobacco: Never  Vaping Use   Vaping Use: Never used  Substance and Sexual Activity   Alcohol use: Yes    Alcohol/week: 2.0 standard drinks of alcohol    Types: 2 Cans of beer per week   Drug use: No   Sexual activity: Not on file  Other Topics Concern   Not on file  Social History Narrative   Married with 3 children   Social Determinants of Health   Financial Resource Strain: Not on file  Food Insecurity: Not on file  Transportation Needs: Not on file  Physical Activity: Not on file  Stress: Not on file  Social Connections: Not on file  Intimate Partner Violence: Not on file    Review of Systems:  All systems reviewed were negative except where noted in HPI.  Physical Exam: General:  Alert, well-developed, in NAD Head:  Normocephalic and atraumatic. Eyes:  Sclera clear, no icterus.   Conjunctiva pink. Ears:  Normal auditory acuity. Mouth:  No deformity or lesions.  Neck:  Supple; no masses . Lungs:  Clear throughout to auscultation.   No wheezes, crackles, or rhonchi. No acute distress. Heart:  Regular rate and rhythm; no murmurs. Abdomen:  Soft, nondistended, nontender. No masses, hepatomegaly. No obvious masses.  Normal bowel .    Rectal:  Deferred   Msk:  Symmetrical without gross deformities.. Pulses:  Normal pulses noted. Extremities:  Without edema. Neurologic:  Alert and  oriented x4;  grossly normal neurologically. Skin:  Intact without significant lesions or rashes. Psych:  Alert and cooperative. Normal mood and affect.   Impression / Sims:   Personal  history of adenomatous and sessile serrated colon polyps for surveillance colonoscopy.  Todd Sims. Todd Sims  04/23/2022, 8:57 AM See Shea Evans, [facility], to contact our on call provider

## 2022-04-23 NOTE — Patient Instructions (Signed)
Please read handouts provided. Continue present medications. Await pathology results.   YOU HAD AN ENDOSCOPIC PROCEDURE TODAY AT THE [facility]:   Refer to the procedure report that was given to you for any specific questions about what was found during the examination.  If the procedure report does not answer your questions, please call your gastroenterologist to clarify.  If you requested that your care partner not be given the details of your procedure findings, then the procedure report has been included in a sealed envelope for you to review at your convenience later.  YOU SHOULD EXPECT: Some feelings of bloating in the abdomen. Passage of more gas than usual.  Walking can help get rid of the air that was put into your GI tract during the procedure and reduce the bloating. If you had a lower endoscopy (such as a colonoscopy or flexible sigmoidoscopy) you may notice spotting of blood in your stool or on the toilet paper. If you underwent a bowel prep for your procedure, you may not have a normal bowel movement for a few days.  Please Note:  You might notice some irritation and congestion in your nose or some drainage.  This is from the oxygen used during your procedure.  There is no need for concern and it should clear up in a day or so.  SYMPTOMS TO REPORT IMMEDIATELY:  Following lower endoscopy (colonoscopy or flexible sigmoidoscopy):  Excessive amounts of blood in the stool  Significant tenderness or worsening of abdominal pains  Swelling of the abdomen that is new, acute  Fever of 100F or higher  For urgent or emergent issues, a gastroenterologist can be reached at any hour by calling [phone]. Do not use MyChart messaging for urgent concerns.    DIET:  We do recommend a small meal at first, but then you may proceed to your regular diet.  Drink plenty of fluids but you should avoid alcoholic beverages for 24 hours.  ACTIVITY:  You should plan to take it easy for  the rest of today and you should NOT DRIVE or use heavy machinery until tomorrow (because of the sedation medicines used during the test).    FOLLOW UP: Our staff will call the number listed on your records the next business day following your procedure.  We will call around 7:15- 8:00 am to check on you and address any questions or concerns that you may have regarding the information given to you following your procedure. If we do not reach you, we will leave a message.     If any biopsies were taken you will be contacted by phone or by letter within the next 1-3 weeks.  Please call us at [phone] if you have not heard about the biopsies in 3 weeks.    SIGNATURES/CONFIDENTIALITY: You and/or your care partner have signed paperwork which will be entered into your electronic medical record.  These signatures attest to the fact that that the information above on your After Visit Summary has been reviewed and is understood.  Full responsibility of the confidentiality of this discharge information lies with you and/or your care-partner. 

## 2022-04-24 ENCOUNTER — Telehealth: Payer: Self-pay

## 2022-04-24 NOTE — Telephone Encounter (Signed)
  Follow up Call-     04/23/2022    8:27 AM  Call back number  Post procedure Call Back phone  # [phone]  Permission to leave phone message Yes     Patient questions:  Do you have a fever, pain , or abdominal swelling? No. Pain Score  0 *  Have you tolerated food without any problems? Yes.    Have you been able to return to your normal activities? Yes.    Do you have any questions about your discharge instructions: Diet   No. Medications  No. Follow up visit  No.  Do you have questions or concerns about your Care? No.  Actions: * If pain score is 4 or above: No action needed, pain <4.

## 2022-05-07 ENCOUNTER — Encounter: Payer: Self-pay | Admitting: Gastroenterology

## 2022-06-11 ENCOUNTER — Encounter: Payer: Self-pay | Admitting: Internal Medicine

## 2022-06-12 ENCOUNTER — Ambulatory Visit (INDEPENDENT_AMBULATORY_CARE_PROVIDER_SITE_OTHER): Payer: BC Managed Care – PPO | Admitting: Family Medicine

## 2022-06-12 ENCOUNTER — Ambulatory Visit (INDEPENDENT_AMBULATORY_CARE_PROVIDER_SITE_OTHER): Payer: BC Managed Care – PPO

## 2022-06-12 ENCOUNTER — Encounter: Payer: Self-pay | Admitting: Family Medicine

## 2022-06-12 VITALS — BP 126/84 | HR 70 | Temp 97.6°F | Ht 71.0 in | Wt 182.0 lb

## 2022-06-12 DIAGNOSIS — R1310 Dysphagia, unspecified: Secondary | ICD-10-CM

## 2022-06-12 DIAGNOSIS — R070 Pain in throat: Secondary | ICD-10-CM

## 2022-06-12 DIAGNOSIS — R519 Headache, unspecified: Secondary | ICD-10-CM

## 2022-06-12 DIAGNOSIS — M542 Cervicalgia: Secondary | ICD-10-CM | POA: Diagnosis not present

## 2022-06-12 LAB — CBC WITH DIFFERENTIAL/PLATELET
Basophils Absolute: 0.1 10*3/uL (ref 0.0–0.1)
Basophils Relative: 0.9 % (ref 0.0–3.0)
Eosinophils Absolute: 0.1 10*3/uL (ref 0.0–0.7)
Eosinophils Relative: 1.4 % (ref 0.0–5.0)
HCT: 44.4 % (ref 39.0–52.0)
Hemoglobin: 15.5 g/dL (ref 13.0–17.0)
Lymphocytes Relative: 32 % (ref 12.0–46.0)
Lymphs Abs: 2.1 10*3/uL (ref 0.7–4.0)
MCHC: 34.8 g/dL (ref 30.0–36.0)
MCV: 95.8 fl (ref 78.0–100.0)
Monocytes Absolute: 0.5 10*3/uL (ref 0.1–1.0)
Monocytes Relative: 7.7 % (ref 3.0–12.0)
Neutro Abs: 3.9 10*3/uL (ref 1.4–7.7)
Neutrophils Relative %: 58 % (ref 43.0–77.0)
Platelets: 161 10*3/uL (ref 150.0–400.0)
RBC: 4.64 Mil/uL (ref 4.22–5.81)
RDW: 12.5 % (ref 11.5–15.5)
WBC: 6.6 10*3/uL (ref 4.0–10.5)

## 2022-06-12 LAB — SEDIMENTATION RATE: Sed Rate: 14 mm/hr (ref 0–20)

## 2022-06-12 LAB — C-REACTIVE PROTEIN: CRP: <1 mg/dL (ref 0.5–20.0)

## 2022-06-12 MED ORDER — METHYLPREDNISOLONE ACETATE 80 MG/ML IJ SUSP
80.0000 mg | Freq: Once | INTRAMUSCULAR | Status: AC
  Administered 2022-06-12: 80 mg via INTRAMUSCULAR

## 2022-06-12 MED ORDER — METHOCARBAMOL 500 MG PO TABS: 500.0000 mg | ORAL_TABLET | Freq: Three times a day (TID) | ORAL | 0 refills | Status: DC | PRN

## 2022-06-12 NOTE — Patient Instructions (Signed)
You received a steroid injection in the office.   Take ibuprofen 800 mg every 8 hours with food. Use ice or heat on the area.   Use the muscle relaxant as needed but be aware this medication is sedating. Do not drive or operate machinery.   We will be in touch with your results.   If you get worse over the weekend such as fever, chills, trouble swallowing or breathing then call 911 or go to the emergency department.

## 2022-06-12 NOTE — Progress Notes (Unsigned)
Subjective:     Patient ID: Todd Sims, male    DOB: Dec 02, 1965, 57 y.o.   MRN: [id number]  Chief Complaint  Patient presents with   Neck Pain   Jaw Pain    Right sided facial pain for the last week that will come and go in different areas    HPI Patient is in today for a 1 wk hx of right sided intermittent facial, neck and head pain.  Some pain with swallowing. No difficulty eating or drinking. No drooling.  No injury.   Denies fever, chills, dizziness, headache, ear pain, sore throat, chest pain, palpitations, shortness of breath, abdominal pain, N/V/D.      Health Maintenance Due  Topic Date Due   DTaP/Tdap/Td (2 - Td or Tdap) 08/06/2021    Past Medical History:  Diagnosis Date   Allergic rhinitis    ALLERGIC RHINITIS 08/26/2007   Allergy    Anxiety 08/06/2010   Cervical neck pain with evidence of disc disease 08/06/2010   Chronic prostatitis 08/07/2011   Degenerative disk disease    lumbar and C- spine   Depression    GERD 04/18/2010   GERD (gastroesophageal reflux disease)    Hyperlipemia    HYPERLIPIDEMIA 08/26/2007   Neck pain    Premature ejaculation 08/06/2010   Shoulder pain, right    Thoracic outlet syndrome 08/07/2011    Past Surgical History:  Procedure Laterality Date   COLONOSCOPY     THORACIC OUTLET SURGERY Right 2014    Family History  Problem Relation Age of Onset   Lung cancer Mother    Colon polyps Father    Colon cancer Neg Hx    Esophageal cancer Neg Hx    Liver cancer Neg Hx    Pancreatic cancer Neg Hx    Rectal cancer Neg Hx    Stomach cancer Neg Hx     Social History   Socioeconomic History   Marital status: Married    Spouse name: Not on file   Number of children: Not on file   Years of education: Not on file   Highest education level: Not on file  Occupational History    Comment: Metallurgist  Tobacco Use   Smoking status: Never   Smokeless tobacco: Never  Vaping Use   Vaping Use: Never used   Substance and Sexual Activity   Alcohol use: Yes    Alcohol/week: 2.0 standard drinks of alcohol    Types: 2 Cans of beer per week   Drug use: No   Sexual activity: Not on file  Other Topics Concern   Not on file  Social History Narrative   Married with 3 children   Social Determinants of Health   Financial Resource Strain: Not on file  Food Insecurity: Not on file  Transportation Needs: Not on file  Physical Activity: Not on file  Stress: Not on file  Social Connections: Not on file  Intimate Partner Violence: Not on file    Outpatient Medications Prior to Visit  Medication Sig Dispense Refill   gabapentin (NEURONTIN) 300 MG capsule Take 1 capsule every day by oral route at bedtime for 30 days. (Patient not taking: Reported on 06/12/2022)     sertraline (ZOLOFT) 100 MG tablet TAKE 1 TABLET DAILY (Patient not taking: Reported on 06/12/2022) 90 tablet 3   No facility-administered medications prior to visit.    No Known Allergies  ROS     Objective:    Physical Exam Constitutional:  General: Todd Sims is not in acute distress.    Appearance: Todd Sims is not ill-appearing.  HENT:     Head:     Jaw: No tenderness, swelling or pain on movement.     Salivary Glands: Right salivary gland is not diffusely enlarged or tender. Left salivary gland is not diffusely enlarged or tender.     Comments: No temporal artery fullness or tenderness to palpation     Right Ear: Tympanic membrane, ear canal and external ear normal.     Left Ear: Tympanic membrane, ear canal and external ear normal.     Nose: Nose normal.     Mouth/Throat:     Lips: Pink.     Mouth: Mucous membranes are moist. No oral lesions.     Dentition: No dental abscesses.     Pharynx: Oropharynx is clear. No pharyngeal swelling, oropharyngeal exudate, posterior oropharyngeal erythema or uvula swelling.  Eyes:     General: Lids are normal.     Extraocular Movements: Extraocular movements intact.     Conjunctiva/sclera:  Conjunctivae normal.     Pupils: Pupils are equal, round, and reactive to light.  Neck:     Thyroid: No thyromegaly or thyroid tenderness.     Vascular: No JVD.     Trachea: Trachea normal.  Cardiovascular:     Rate and Rhythm: Normal rate and regular rhythm.  Pulmonary:     Effort: Pulmonary effort is normal.     Breath sounds: Normal breath sounds.  Musculoskeletal:     Cervical back: Full passive range of motion without pain and neck supple.  Lymphadenopathy:     Cervical: No cervical adenopathy.     Right cervical: No superficial, deep or posterior cervical adenopathy.    Left cervical: No superficial, deep or posterior cervical adenopathy.  Neurological:     Mental Status: Todd Sims is alert.     BP 126/84 (BP Location: Left Arm, Patient Position: Sitting, Cuff Size: Large)   Pulse 70   Temp 97.6 F (36.4 C) (Temporal)   Ht '5\' 11"'$  (1.803 m)   Wt 182 lb (82.6 kg)   SpO2 98%   BMI 25.38 kg/m  Wt Readings from Last 3 Encounters:  06/12/22 182 lb (82.6 kg)  04/23/22 180 lb (81.6 kg)  02/26/22 185 lb (83.9 kg)       Assessment & Plan:   Problem List Items Addressed This Visit   None Visit Diagnoses     Right sided facial pain    -  Primary   Relevant Medications   methocarbamol (ROBAXIN) 500 MG tablet   methylPREDNISolone acetate (DEPO-MEDROL) injection 80 mg (Completed)   Other Relevant Orders   CBC with Differential/Platelet (Completed)   Sedimentation rate (Completed)   C-reactive protein (Completed)   Throat pain in adult       Relevant Medications   methylPREDNISolone acetate (DEPO-MEDROL) injection 80 mg (Completed)   Temporal pain       Relevant Medications   methocarbamol (ROBAXIN) 500 MG tablet   methylPREDNISolone acetate (DEPO-MEDROL) injection 80 mg (Completed)   Other Relevant Orders   CBC with Differential/Platelet (Completed)   Sedimentation rate (Completed)   C-reactive protein (Completed)   Painful swallowing       Relevant Medications    methylPREDNISolone acetate (DEPO-MEDROL) injection 80 mg (Completed)   Other Relevant Orders   DG Neck Soft Tissue (Completed)   Neck pain on right side       Relevant Medications   methocarbamol (ROBAXIN) 500 MG  tablet   methylPREDNISolone acetate (DEPO-MEDROL) injection 80 mg (Completed)   Other Relevant Orders   DG Neck Soft Tissue (Completed)   CBC with Differential/Platelet (Completed)   Sedimentation rate (Completed)   C-reactive protein (Completed)      No red flag symptoms on exam.  Stat neck soft tissue x-ray ordered as well as CBC, sed rate and C-reactive protein. X-ray negative. Discussed possible etiologies including shingles and Todd Sims will let me know immediately if Todd Sims gets a rash or be seen over the weekend.  No signs of temporal arteritis per labs.  No obvious dental abnormality but I do recommend that Todd Sims schedule with his dentist.  No sign of TMJ symptoms. no sign of infection with normal CBC.  Depo-Medrol 80 mg IM injection given in office.  Discussed ibuprofen, Tylenol, warm compresses and muscle relaxant Robaxin prescribed to take as needed.  Todd Sims is aware that the medication is sedating and will only take at bedtime or if Todd Sims is not driving or operating machinery.  Follow-up if worsening or not improving in the next few days.  I have discontinued Maudry Diego. Drewry's sertraline and gabapentin. I am also having him start on methocarbamol. We administered methylPREDNISolone acetate.  Meds ordered this encounter  Medications   methocarbamol (ROBAXIN) 500 MG tablet    Sig: Take 1 tablet (500 mg total) by mouth every 8 (eight) hours as needed for muscle spasms.    Dispense:  30 tablet    Refill:  0    Order Specific Question:   Supervising Provider    Answer:   Pricilla Holm A [[id number]]   methylPREDNISolone acetate (DEPO-MEDROL) injection 80 mg

## 2022-06-22 ENCOUNTER — Encounter: Payer: Self-pay | Admitting: Internal Medicine

## 2022-06-22 NOTE — Telephone Encounter (Signed)
Please advise 

## 2022-10-20 ENCOUNTER — Ambulatory Visit: Payer: BC Managed Care – PPO | Admitting: Internal Medicine

## 2022-10-20 ENCOUNTER — Encounter: Payer: Self-pay | Admitting: Internal Medicine

## 2022-10-20 VITALS — BP 120/76 | HR 65 | Temp 98.5°F | Ht 71.0 in | Wt 179.0 lb

## 2022-10-20 DIAGNOSIS — H5712 Ocular pain, left eye: Secondary | ICD-10-CM | POA: Diagnosis not present

## 2022-10-20 DIAGNOSIS — F32A Depression, unspecified: Secondary | ICD-10-CM | POA: Insufficient documentation

## 2022-10-20 DIAGNOSIS — K219 Gastro-esophageal reflux disease without esophagitis: Secondary | ICD-10-CM | POA: Insufficient documentation

## 2022-10-20 MED ORDER — DOXYCYCLINE HYCLATE 100 MG PO TABS: 100.0000 mg | ORAL_TABLET | Freq: Two times a day (BID) | ORAL | 0 refills | Status: DC

## 2022-10-20 NOTE — Patient Instructions (Addendum)
Please take all new medication as prescribed - the antibiotic  You will be contacted regarding the referral for: eye doctor (ophthalmology)  Please continue all other medications as before, and refills have been done if requested.  Please have the pharmacy call with any other refills you may need.  Please continue your efforts at being more active, low cholesterol diet, and weight control.  You are otherwise up to date with prevention measures today.  Please keep your appointments with your specialists as you may have planned  Please make an Appointment to return in aug 30, or sooner if needed

## 2022-10-20 NOTE — Progress Notes (Signed)
Patient ID: Todd Sims, male   DOB: 1965/11/08, 57 y.o.   MRN: [id number]        Chief Complaint: follow up left eye pain       HPI:  Todd Sims is a 57 y.o. male here with c/o just over 4 wks onset left medial lower eyelid foreign body sensation but cannot see any object or swelling of the lid; does have right upper lid swelilng and mild erythem swelling over the left maxillary sinus area. No vision change, chills or headache.  No recent truama.   Pt denies chest pain, increased sob or doe, wheezing, orthopnea, PND, increased LE swelling, palpitations, dizziness or syncope.   Pt denies polydipsia, polyuria, or new focal neuro s/s.    Pt denies fever, wt loss, night sweats, loss of appetite, or other constitutional symptoms         Wt Readings from Last 3 Encounters:  10/20/22 179 lb (81.2 kg)  06/12/22 182 lb (82.6 kg)  04/23/22 180 lb (81.6 kg)   BP Readings from Last 3 Encounters:  10/20/22 120/76  06/12/22 126/84  04/23/22 123/82         Past Medical History:  Diagnosis Date   ALLERGIC RHINITIS 08/26/2007   Anxiety 08/06/2010   Cervical neck pain with evidence of disc disease 08/06/2010   Chronic prostatitis 08/07/2011   Degenerative disk disease    lumbar and C- spine   Depression    GERD 04/18/2010   HYPERLIPIDEMIA 08/26/2007   Premature ejaculation 08/06/2010   Thoracic outlet syndrome 08/07/2011   Past Surgical History:  Procedure Laterality Date   COLONOSCOPY     THORACIC OUTLET SURGERY Right 2014    reports that he has never smoked. He has never used smokeless tobacco. He reports current alcohol use of about 2.0 standard drinks of alcohol per week. He reports that he does not use drugs. family history includes Colon polyps in his father; Lung cancer in his mother. No Known Allergies No current outpatient medications on file prior to visit.   No current facility-administered medications on file prior to visit.        ROS:  All others reviewed and  negative.  Objective        PE:  BP 120/76 (BP Location: Right Arm, Patient Position: Sitting, Cuff Size: Normal)   Pulse 65   Temp 98.5 F (36.9 C) (Oral)   Ht 5\' 11"  (1.803 m)   Wt 179 lb (81.2 kg)   SpO2 96%   BMI 24.97 kg/m                 Constitutional: Pt appears in NAD               HENT: Head: NCAT.                Right Ear: External ear normal.                 Left Ear: External ear normal.                Eyes: . Pupils are equal, round, and reactive to light. Conjunctivae and EOM are normal; left eye with trace to 1+ redness swelling of the left upper > left lower lids and left maxillary sinus area, mild tender               Nose: without d/c or deformity  Neck: Neck supple. Gross normal ROM               Cardiovascular: Normal rate and regular rhythm.                 Pulmonary/Chest: Effort normal and breath sounds without rales or wheezing.                               Neurological: Pt is alert. At baseline orientation, motor grossly intact               Skin: Skin is warm. No rashes, no other new lesions, LE edema - none               Psychiatric: Pt behavior is normal without agitation   Micro: none  Cardiac tracings I have personally interpreted today:  none  Pertinent Radiological findings (summarize): none   Lab Results  Component Value Date   WBC 6.6 06/12/2022   HGB 15.5 06/12/2022   HCT 44.4 06/12/2022   PLT 161.0 06/12/2022   GLUCOSE 84 12/03/2021   CHOL 203 (H) 12/03/2021   TRIG 328.0 (H) 12/03/2021   HDL 36.20 (L) 12/03/2021   LDLDIRECT 122.0 12/03/2021   LDLCALC 147 (H) 09/17/2020   ALT 41 12/03/2021   AST 54 (H) 12/03/2021   NA 137 12/03/2021   K 4.7 12/03/2021   CL 104 12/03/2021   CREATININE 0.91 12/03/2021   BUN 16 12/03/2021   CO2 27 12/03/2021   TSH 1.44 12/03/2021   PSA 1.23 12/03/2021   INR 1.0 10/27/2019   Assessment/Plan:  Todd Sims is a 57 y.o. White or Caucasian [1] male with  has a past medical  history of ALLERGIC RHINITIS (08/26/2007), Anxiety (08/06/2010), Cervical neck pain with evidence of disc disease (08/06/2010), Chronic prostatitis (08/07/2011), Degenerative disk disease, Depression, GERD (04/18/2010), HYPERLIPIDEMIA (08/26/2007), Premature ejaculation (08/06/2010), and Thoracic outlet syndrome (08/07/2011).  Left eye pain Mild to mod, with periorbital redness swelling and foreign body type sensation at the left lower medial lid area, now for antibx course doxycycline and refer optho,  to f/u any worsening symptoms or concerns  Followup: Return in about 7 weeks (around 12/11/2022).  Oliver Barre, MD 10/20/2022 9:44 AM [facility]

## 2022-10-20 NOTE — Assessment & Plan Note (Signed)
Mild to mod, with periorbital redness swelling and foreign body type sensation at the left lower medial lid area, now for antibx course doxycycline and refer optho,  to f/u any worsening symptoms or concerns

## 2022-10-26 DIAGNOSIS — H1013 Acute atopic conjunctivitis, bilateral: Secondary | ICD-10-CM | POA: Diagnosis not present

## 2022-10-26 DIAGNOSIS — H0288B Meibomian gland dysfunction left eye, upper and lower eyelids: Secondary | ICD-10-CM | POA: Diagnosis not present

## 2022-10-26 DIAGNOSIS — H0288A Meibomian gland dysfunction right eye, upper and lower eyelids: Secondary | ICD-10-CM | POA: Diagnosis not present

## 2022-11-11 ENCOUNTER — Encounter (INDEPENDENT_AMBULATORY_CARE_PROVIDER_SITE_OTHER): Payer: Self-pay

## 2022-11-30 DIAGNOSIS — H0288A Meibomian gland dysfunction right eye, upper and lower eyelids: Secondary | ICD-10-CM | POA: Diagnosis not present

## 2022-11-30 DIAGNOSIS — H0288B Meibomian gland dysfunction left eye, upper and lower eyelids: Secondary | ICD-10-CM | POA: Diagnosis not present

## 2022-11-30 DIAGNOSIS — H1013 Acute atopic conjunctivitis, bilateral: Secondary | ICD-10-CM | POA: Diagnosis not present

## 2022-12-11 ENCOUNTER — Encounter: Payer: BC Managed Care – PPO | Admitting: Internal Medicine

## 2023-01-04 ENCOUNTER — Encounter: Payer: Self-pay | Admitting: Internal Medicine

## 2023-01-04 ENCOUNTER — Ambulatory Visit: Payer: BC Managed Care – PPO | Admitting: Internal Medicine

## 2023-01-04 VITALS — BP 122/80 | HR 54 | Temp 98.2°F | Ht 71.0 in | Wt 175.0 lb

## 2023-01-04 DIAGNOSIS — Z0001 Encounter for general adult medical examination with abnormal findings: Secondary | ICD-10-CM | POA: Insufficient documentation

## 2023-01-04 DIAGNOSIS — M19049 Primary osteoarthritis, unspecified hand: Secondary | ICD-10-CM

## 2023-01-04 DIAGNOSIS — I10 Essential (primary) hypertension: Secondary | ICD-10-CM | POA: Diagnosis not present

## 2023-01-04 DIAGNOSIS — Z125 Encounter for screening for malignant neoplasm of prostate: Secondary | ICD-10-CM | POA: Diagnosis not present

## 2023-01-04 DIAGNOSIS — R739 Hyperglycemia, unspecified: Secondary | ICD-10-CM

## 2023-01-04 DIAGNOSIS — E538 Deficiency of other specified B group vitamins: Secondary | ICD-10-CM

## 2023-01-04 DIAGNOSIS — E559 Vitamin D deficiency, unspecified: Secondary | ICD-10-CM

## 2023-01-04 DIAGNOSIS — E7800 Pure hypercholesterolemia, unspecified: Secondary | ICD-10-CM | POA: Diagnosis not present

## 2023-01-04 DIAGNOSIS — Z0000 Encounter for general adult medical examination without abnormal findings: Secondary | ICD-10-CM

## 2023-01-04 LAB — URINALYSIS, ROUTINE W REFLEX MICROSCOPIC
Bilirubin Urine: NEGATIVE
Hgb urine dipstick: NEGATIVE
Ketones, ur: NEGATIVE
Leukocytes,Ua: NEGATIVE
Nitrite: NEGATIVE
Specific Gravity, Urine: 1.025 (ref 1.000–1.030)
Total Protein, Urine: NEGATIVE
Urine Glucose: NEGATIVE
Urobilinogen, UA: 0.2 (ref 0.0–1.0)
pH: 6 (ref 5.0–8.0)

## 2023-01-04 LAB — CBC WITH DIFFERENTIAL/PLATELET
Basophils Absolute: 0 10*3/uL (ref 0.0–0.1)
Basophils Relative: 0.9 % (ref 0.0–3.0)
Eosinophils Absolute: 0.1 10*3/uL (ref 0.0–0.7)
Eosinophils Relative: 1.9 % (ref 0.0–5.0)
HCT: 42.1 % (ref 39.0–52.0)
Hemoglobin: 14.3 g/dL (ref 13.0–17.0)
Lymphocytes Relative: 33.7 % (ref 12.0–46.0)
Lymphs Abs: 1.6 10*3/uL (ref 0.7–4.0)
MCHC: 33.9 g/dL (ref 30.0–36.0)
MCV: 97.4 fl (ref 78.0–100.0)
Monocytes Absolute: 0.4 10*3/uL (ref 0.1–1.0)
Monocytes Relative: 7.8 % (ref 3.0–12.0)
Neutro Abs: 2.6 10*3/uL (ref 1.4–7.7)
Neutrophils Relative %: 55.7 % (ref 43.0–77.0)
Platelets: 146 10*3/uL — ABNORMAL LOW (ref 150.0–400.0)
RBC: 4.32 Mil/uL (ref 4.22–5.81)
RDW: 12.8 % (ref 11.5–15.5)
WBC: 4.7 10*3/uL (ref 4.0–10.5)

## 2023-01-04 LAB — LIPID PANEL
Cholesterol: 184 mg/dL (ref 0–200)
HDL: 49.5 mg/dL (ref >39.00)
LDL Cholesterol: 120 mg/dL — ABNORMAL HIGH (ref 0–99)
NonHDL: 134.2
Total CHOL/HDL Ratio: 4
Triglycerides: 72 mg/dL (ref 0.0–149.0)
VLDL: 14.4 mg/dL (ref 0.0–40.0)

## 2023-01-04 LAB — HEPATIC FUNCTION PANEL
ALT: 31 U/L (ref 0–53)
AST: 41 U/L — ABNORMAL HIGH (ref 0–37)
Albumin: 4.2 g/dL (ref 3.5–5.2)
Alkaline Phosphatase: 53 U/L (ref 39–117)
Bilirubin, Direct: 0.2 mg/dL (ref 0.0–0.3)
Total Bilirubin: 1.6 mg/dL — ABNORMAL HIGH (ref 0.2–1.2)
Total Protein: 6.4 g/dL (ref 6.0–8.3)

## 2023-01-04 LAB — VITAMIN D 25 HYDROXY (VIT D DEFICIENCY, FRACTURES): VITD: 20.39 ng/mL — ABNORMAL LOW (ref 30.00–100.00)

## 2023-01-04 LAB — BASIC METABOLIC PANEL
BUN: 20 mg/dL (ref 6–23)
CO2: 26 mEq/L (ref 19–32)
Calcium: 9.1 mg/dL (ref 8.4–10.5)
Chloride: 108 mEq/L (ref 96–112)
Creatinine, Ser: 0.95 mg/dL (ref 0.40–1.50)
GFR: 89.75 mL/min (ref >60.00)
Glucose, Bld: 83 mg/dL (ref 70–99)
Potassium: 4.3 mEq/L (ref 3.5–5.1)
Sodium: 141 mEq/L (ref 135–145)

## 2023-01-04 LAB — VITAMIN B12: Vitamin B-12: 216 pg/mL (ref 211–911)

## 2023-01-04 LAB — HEMOGLOBIN A1C: Hgb A1c MFr Bld: 4.8 % (ref 4.6–6.5)

## 2023-01-04 LAB — TSH: TSH: 0.86 u[IU]/mL (ref 0.35–5.50)

## 2023-01-04 LAB — PSA: PSA: 1.26 ng/mL (ref 0.10–4.00)

## 2023-01-04 NOTE — Progress Notes (Unsigned)
Patient ID: Todd Sims, male   DOB: 1965-05-21, 57 y.o.   MRN: [id number]         Chief Complaint:: wellness exam and Annual Exam (Concerns about High BP. Feels pressure in head , stiffness in both hands mostly right ) , hld        HPI:  Todd Sims is a 57 y.o. male here for wellness exam; for tdatp at pharmacy, o/w up to date                        Also states BP has been mildly elevated at home. Pt denies chest pain, increased sob or doe, wheezing, orthopnea, PND, increased LE swelling, palpitations, dizziness or syncope.   Pt denies polydipsia, polyuria, or new focal neuro s/s.    Pt denies fever, wt loss, night sweats, loss of appetite, or other constitutional symptoms  Also has stiffness in the fingers of the hands mostly in the AM, no swelling.      Wt Readings from Last 3 Encounters:  01/04/23 175 lb (79.4 kg)  10/20/22 179 lb (81.2 kg)  06/12/22 182 lb (82.6 kg)   BP Readings from Last 3 Encounters:  01/04/23 122/80  10/20/22 120/76  06/12/22 126/84   Immunization History  Administered Date(s) Administered   Tdap 08/07/2011   Zoster Recombinant(Shingrix) 12/18/2020, 02/13/2021   Health Maintenance Due  Topic Date Due   HIV Screening  Never done   DTaP/Tdap/Td (2 - Td or Tdap) 08/06/2021      Past Medical History:  Diagnosis Date   ALLERGIC RHINITIS 08/26/2007   Anxiety 08/06/2010   Cervical neck pain with evidence of disc disease 08/06/2010   Chronic prostatitis 08/07/2011   Degenerative disk disease    lumbar and C- spine   Depression    GERD 04/18/2010   HYPERLIPIDEMIA 08/26/2007   Premature ejaculation 08/06/2010   Thoracic outlet syndrome 08/07/2011   Past Surgical History:  Procedure Laterality Date   COLONOSCOPY     THORACIC OUTLET SURGERY Right 2014    reports that he has never smoked. He has never used smokeless tobacco. He reports current alcohol use of about 2.0 standard drinks of alcohol per week. He reports that he does not use  drugs. family history includes Colon polyps in his father; Lung cancer in his mother. No Known Allergies No current outpatient medications on file prior to visit.   No current facility-administered medications on file prior to visit.        ROS:  All others reviewed and negative.  Objective        PE:  BP 122/80 (BP Location: Right Arm, Patient Position: Sitting, Cuff Size: Normal)   Pulse (!) 54   Temp 98.2 F (36.8 C) (Oral)   Ht 5\' 11"  (1.803 m)   Wt 175 lb (79.4 kg)   SpO2 97%   BMI 24.41 kg/m                 Constitutional: Pt appears in NAD               HENT: Head: NCAT.                Right Ear: External ear normal.                 Left Ear: External ear normal.                Eyes: . Pupils are equal, round,  and reactive to light. Conjunctivae and EOM are normal               Nose: without d/c or deformity               Neck: Neck supple. Gross normal ROM               Cardiovascular: Normal rate and regular rhythm.                 Pulmonary/Chest: Effort normal and breath sounds without rales or wheezing.                Abd:  Soft, NT, ND, + BS, no organomegaly               Neurological: Pt is alert. At baseline orientation, motor grossly intact               Skin: Skin is warm. No rashes, no other new lesions, LE edema - none; bilateral fingers OA changes mild.                 Psychiatric: Pt behavior is normal without agitation   Micro: none  Cardiac tracings I have personally interpreted today:  none  Pertinent Radiological findings (summarize): none   Lab Results  Component Value Date   WBC 4.7 01/04/2023   HGB 14.3 01/04/2023   HCT 42.1 01/04/2023   PLT 146.0 (L) 01/04/2023   GLUCOSE 83 01/04/2023   CHOL 184 01/04/2023   TRIG 72.0 01/04/2023   HDL 49.50 01/04/2023   LDLDIRECT 122.0 12/03/2021   LDLCALC 120 (H) 01/04/2023   ALT 31 01/04/2023   AST 41 (H) 01/04/2023   NA 141 01/04/2023   K 4.3 01/04/2023   CL 108 01/04/2023   CREATININE 0.95  01/04/2023   BUN 20 01/04/2023   CO2 26 01/04/2023   TSH 0.86 01/04/2023   PSA 1.26 01/04/2023   INR 1.0 10/27/2019   HGBA1C 4.8 01/04/2023   Assessment/Plan:  Todd Sims is a 57 y.o. White or Caucasian [1] male with  has a past medical history of ALLERGIC RHINITIS (08/26/2007), Anxiety (08/06/2010), Cervical neck pain with evidence of disc disease (08/06/2010), Chronic prostatitis (08/07/2011), Degenerative disk disease, Depression, GERD (04/18/2010), HYPERLIPIDEMIA (08/26/2007), Premature ejaculation (08/06/2010), and Thoracic outlet syndrome (08/07/2011).  Encounter for well adult exam with abnormal findings Age and sex appropriate education and counseling updated with regular exercise and diet Referrals for preventative services - none needed Immunizations addressed - for tdap at pharmacy Smoking counseling  - none needed Evidence for depression or other mood disorder - none significant Most recent labs reviewed. I have personally reviewed and have noted: 1) the patient's medical and social history 2) The patient's current medications and supplements 3) The patient's height, weight, and BMI have been recorded in the chart   Hand arthritis Mild, for volt gel prn,  to f/u any worsening symptoms or concerns  HTN (hypertension) BP Readings from Last 3 Encounters:  01/04/23 122/80  10/20/22 120/76  06/12/22 126/84   Mild elev at home, ok for losartan 25 mg every day, f/u bp at home and next visit   HYPERLIPIDEMIA Lab Results  Component Value Date   LDLCALC 120 (H) 01/04/2023   uncontrolled, pt for lower chol diet, declines statin  B12 deficiency Lab Results  Component Value Date   VITAMINB12 216 01/04/2023   Low, to start oral replacement - b12 1000 mcg qd   Vitamin D deficiency Last vitamin  D Lab Results  Component Value Date   VD25OH 20.39 (L) 01/04/2023   Low, to start oral replacement   Followup: Return in about 1 year (around 01/04/2024).  Oliver Barre, MD 01/07/2023 8:29 PM [facility]  Primary Care - [facility]

## 2023-01-04 NOTE — Patient Instructions (Addendum)
Please consider Tdap tetanus at the pharmacy  Ok to take the losartan 25 mg per day; and call in 1-2 wks if you are not improved with the BP at home  Please continue all other medications as before, and refills have been done if requested.  Please have the pharmacy call with any other refills you may need.  Please continue your efforts at being more active, low cholesterol diet, and weight control.  You are otherwise up to date with prevention measures today.  Please keep your appointments with your specialists as you may have planned  Please go to the LAB at the blood drawing area for the tests to be done  You will be contacted by phone if any changes need to be made immediately.  Otherwise, you will receive a letter about your results with an explanation, but please check with MyChart first.  Please make an Appointment to return for your 1 year visit, or sooner if needed

## 2023-01-06 ENCOUNTER — Encounter: Payer: Self-pay | Admitting: Internal Medicine

## 2023-01-07 ENCOUNTER — Encounter: Payer: Self-pay | Admitting: Internal Medicine

## 2023-01-07 DIAGNOSIS — E538 Deficiency of other specified B group vitamins: Secondary | ICD-10-CM | POA: Insufficient documentation

## 2023-01-07 DIAGNOSIS — M19049 Primary osteoarthritis, unspecified hand: Secondary | ICD-10-CM | POA: Insufficient documentation

## 2023-01-07 DIAGNOSIS — E559 Vitamin D deficiency, unspecified: Secondary | ICD-10-CM | POA: Insufficient documentation

## 2023-01-07 MED ORDER — LOSARTAN POTASSIUM 25 MG PO TABS: 25.0000 mg | ORAL_TABLET | Freq: Every day | ORAL | 3 refills | Status: DC

## 2023-01-07 NOTE — Assessment & Plan Note (Signed)
Mild, for volt gel prn,  to f/u any worsening symptoms or concerns

## 2023-01-07 NOTE — Assessment & Plan Note (Signed)
Lab Results  Component Value Date   LDLCALC 120 (H) 01/04/2023   uncontrolled, pt for lower chol diet, declines statin

## 2023-01-07 NOTE — Assessment & Plan Note (Signed)
Lab Results  Component Value Date   VITAMINB12 216 01/04/2023   Low, to start oral replacement - b12 1000 mcg qd

## 2023-01-07 NOTE — Assessment & Plan Note (Signed)

## 2023-01-07 NOTE — Assessment & Plan Note (Signed)
BP Readings from Last 3 Encounters:  01/04/23 122/80  10/20/22 120/76  06/12/22 126/84   Mild elev at home, ok for losartan 25 mg every day, f/u bp at home and next visit

## 2023-01-07 NOTE — Assessment & Plan Note (Signed)
Last vitamin D Lab Results  Component Value Date   VD25OH 20.39 (L) 01/04/2023   Low, to start oral replacement

## 2023-03-03 ENCOUNTER — Encounter: Payer: Self-pay | Admitting: Internal Medicine

## 2023-03-03 MED ORDER — LOSARTAN POTASSIUM 50 MG PO TABS: 50.0000 mg | ORAL_TABLET | Freq: Every day | ORAL | 3 refills | Status: DC

## 2023-03-17 ENCOUNTER — Other Ambulatory Visit: Payer: Self-pay

## 2023-03-17 MED ORDER — LOSARTAN POTASSIUM 50 MG PO TABS: 50.0000 mg | ORAL_TABLET | Freq: Every day | ORAL | 3 refills | Status: DC

## 2023-04-02 ENCOUNTER — Ambulatory Visit (INDEPENDENT_AMBULATORY_CARE_PROVIDER_SITE_OTHER): Payer: BC Managed Care – PPO | Admitting: Internal Medicine

## 2023-04-02 ENCOUNTER — Encounter: Payer: Self-pay | Admitting: Internal Medicine

## 2023-04-02 VITALS — BP 140/100 | HR 94 | Temp 98.4°F | Ht 71.0 in | Wt 177.0 lb

## 2023-04-02 DIAGNOSIS — E538 Deficiency of other specified B group vitamins: Secondary | ICD-10-CM

## 2023-04-02 DIAGNOSIS — E7800 Pure hypercholesterolemia, unspecified: Secondary | ICD-10-CM | POA: Diagnosis not present

## 2023-04-02 DIAGNOSIS — E559 Vitamin D deficiency, unspecified: Secondary | ICD-10-CM | POA: Diagnosis not present

## 2023-04-02 DIAGNOSIS — I10 Essential (primary) hypertension: Secondary | ICD-10-CM

## 2023-04-02 MED ORDER — OLMESARTAN MEDOXOMIL 40 MG PO TABS: 40.0000 mg | ORAL_TABLET | Freq: Every day | ORAL | 3 refills | Status: DC

## 2023-04-02 NOTE — Assessment & Plan Note (Signed)
Lab Results  Component Value Date   LDLCALC 120 (H) 01/04/2023   uncontrolled, pt for lower chol diet, declines statin for now

## 2023-04-02 NOTE — Progress Notes (Signed)
Patient ID: Todd Sims, male   DOB: 1965/11/14, 57 y.o.   MRN: [id number]        Chief Complaint: follow up HTN, HLD and low vit d and b12        HPI:  Todd Sims is a 57 y.o. male here overall doing ok,  Pt denies chest pain, increased sob or doe, wheezing, orthopnea, PND, increased LE swelling, palpitations, dizziness or syncope.   Pt denies polydipsia, polyuria, or new focal neuro s/s.    Pt denies fever, wt loss, night sweats, loss of appetite, or other constitutional symptoms  BP has remained mild elevated despite good compliance with losartan 50 mg..    Wt Readings from Last 3 Encounters:  04/02/23 177 lb (80.3 kg)  01/04/23 175 lb (79.4 kg)  10/20/22 179 lb (81.2 kg)   BP Readings from Last 3 Encounters:  04/02/23 (!) 140/100  01/04/23 122/80  10/20/22 120/76         Past Medical History:  Diagnosis Date   ALLERGIC RHINITIS 08/26/2007   Anxiety 08/06/2010   Cervical neck pain with evidence of disc disease 08/06/2010   Chronic prostatitis 08/07/2011   Degenerative disk disease    lumbar and C- spine   Depression    GERD 04/18/2010   HYPERLIPIDEMIA 08/26/2007   Premature ejaculation 08/06/2010   Thoracic outlet syndrome 08/07/2011   Past Surgical History:  Procedure Laterality Date   COLONOSCOPY     THORACIC OUTLET SURGERY Right 2014    reports that he has never smoked. He has never used smokeless tobacco. He reports current alcohol use of about 2.0 standard drinks of alcohol per week. He reports that he does not use drugs. family history includes Colon polyps in his father; Lung cancer in his mother. No Known Allergies No current outpatient medications on file prior to visit.   No current facility-administered medications on file prior to visit.        ROS:  All others reviewed and negative.  Objective        PE:  BP (!) 140/100 (BP Location: Left Arm, Patient Position: Sitting, Cuff Size: Normal)   Pulse 94   Temp 98.4 F (36.9 C) (Oral)   Ht 5\' 11"   (1.803 m)   Wt 177 lb (80.3 kg)   SpO2 97%   BMI 24.69 kg/m                 Constitutional: Pt appears in NAD               HENT: Head: NCAT.                Right Ear: External ear normal.                 Left Ear: External ear normal.                Eyes: . Pupils are equal, round, and reactive to light. Conjunctivae and EOM are normal               Nose: without d/c or deformity               Neck: Neck supple. Gross normal ROM               Cardiovascular: Normal rate and regular rhythm.                 Pulmonary/Chest: Effort normal and breath sounds without rales or wheezing.  Abd:  Soft, NT, ND, + BS, no organomegaly               Neurological: Pt is alert. At baseline orientation, motor grossly intact               Skin: Skin is warm. No rashes, no other new lesions, LE edema - none               Psychiatric: Pt behavior is normal without agitation   Micro: none  Cardiac tracings I have personally interpreted today:  none  Pertinent Radiological findings (summarize): none   Lab Results  Component Value Date   WBC 4.7 01/04/2023   HGB 14.3 01/04/2023   HCT 42.1 01/04/2023   PLT 146.0 (L) 01/04/2023   GLUCOSE 83 01/04/2023   CHOL 184 01/04/2023   TRIG 72.0 01/04/2023   HDL 49.50 01/04/2023   LDLDIRECT 122.0 12/03/2021   LDLCALC 120 (H) 01/04/2023   ALT 31 01/04/2023   AST 41 (H) 01/04/2023   NA 141 01/04/2023   K 4.3 01/04/2023   CL 108 01/04/2023   CREATININE 0.95 01/04/2023   BUN 20 01/04/2023   CO2 26 01/04/2023   TSH 0.86 01/04/2023   PSA 1.26 01/04/2023   INR 1.0 10/27/2019   HGBA1C 4.8 01/04/2023   Assessment/Plan:  Todd Sims is a 57 y.o. White or Caucasian [1] male with  has a past medical history of ALLERGIC RHINITIS (08/26/2007), Anxiety (08/06/2010), Cervical neck pain with evidence of disc disease (08/06/2010), Chronic prostatitis (08/07/2011), Degenerative disk disease, Depression, GERD (04/18/2010), HYPERLIPIDEMIA (08/26/2007),  Premature ejaculation (08/06/2010), and Thoracic outlet syndrome (08/07/2011).  HYPERLIPIDEMIA Lab Results  Component Value Date   LDLCALC 120 (H) 01/04/2023   uncontrolled, pt for lower chol diet, declines statin for now  HTN (hypertension) BP Readings from Last 3 Encounters:  04/02/23 (!) 140/100  01/04/23 122/80  10/20/22 120/76   uncontrolled, pt to change to benicar 40 mg - 1/2 every day for 2 wks and let us know results by mychart, consider 40 mg if still uncontrolled   Vitamin D deficiency Last vitamin D Lab Results  Component Value Date   VD25OH 20.39 (L) 01/04/2023   Low, to start oral replacement   B12 deficiency Lab Results  Component Value Date   VITAMINB12 216 01/04/2023   Low to start oral replacement - b12 1000 mcg qd  Followup: Return in about 6 months (around 10/01/2023).  Oliver Barre, MD 04/02/2023 7:25 PM [facility]

## 2023-04-02 NOTE — Assessment & Plan Note (Signed)
Last vitamin D Lab Results  Component Value Date   VD25OH 20.39 (L) 01/04/2023   Low, to start oral replacement

## 2023-04-02 NOTE — Patient Instructions (Signed)
Ok to take the Benicar 40 mg at HALF per day for the first 2 weeks and let us know the results, to see If you need the 40 mg   Please continue all other medications as before, and refills have been done if requested.  Please have the pharmacy call with any other refills you may need.  Please continue your efforts at being more active, low cholesterol diet, and weight control  Please keep your appointments with your specialists as you may have planned  Please make an Appointment to return in 6 months, or sooner if needed

## 2023-04-02 NOTE — Assessment & Plan Note (Signed)
Lab Results  Component Value Date   VITAMINB12 216 01/04/2023   Low to start oral replacement - b12 1000 mcg qd

## 2023-04-02 NOTE — Assessment & Plan Note (Signed)
BP Readings from Last 3 Encounters:  04/02/23 (!) 140/100  01/04/23 122/80  10/20/22 120/76   uncontrolled, pt to change to benicar 40 mg - 1/2 every day for 2 wks and let us know results by mychart, consider 40 mg if still uncontrolled

## 2023-04-05 DIAGNOSIS — Z85828 Personal history of other malignant neoplasm of skin: Secondary | ICD-10-CM | POA: Diagnosis not present

## 2023-04-05 DIAGNOSIS — L910 Hypertrophic scar: Secondary | ICD-10-CM | POA: Diagnosis not present

## 2023-04-05 DIAGNOSIS — L570 Actinic keratosis: Secondary | ICD-10-CM | POA: Diagnosis not present

## 2023-04-05 DIAGNOSIS — L814 Other melanin hyperpigmentation: Secondary | ICD-10-CM | POA: Diagnosis not present

## 2023-04-05 DIAGNOSIS — C44619 Basal cell carcinoma of skin of left upper limb, including shoulder: Secondary | ICD-10-CM | POA: Diagnosis not present

## 2023-04-27 ENCOUNTER — Encounter: Payer: Self-pay | Admitting: Internal Medicine

## 2023-04-27 MED ORDER — OLMESARTAN MEDOXOMIL 40 MG PO TABS: 40.0000 mg | ORAL_TABLET | Freq: Every day | ORAL | 3 refills | Status: DC

## 2023-05-23 ENCOUNTER — Encounter: Payer: Self-pay | Admitting: Internal Medicine

## 2023-05-24 MED ORDER — PREDNISONE 10 MG PO TABS: ORAL_TABLET | ORAL | 0 refills | Status: DC

## 2023-06-10 MED ORDER — OLMESARTAN MEDOXOMIL 40 MG PO TABS: 40.0000 mg | ORAL_TABLET | Freq: Every day | ORAL | 3 refills | Status: AC

## 2023-06-10 NOTE — Addendum Note (Signed)
 Addended by: Corwin Levins on: 06/10/2023 04:48 PM   Modules accepted: Orders

## 2023-06-17 DIAGNOSIS — B880 Other acariasis: Secondary | ICD-10-CM | POA: Diagnosis not present

## 2023-06-17 DIAGNOSIS — H01001 Unspecified blepharitis right upper eyelid: Secondary | ICD-10-CM | POA: Diagnosis not present

## 2023-06-17 DIAGNOSIS — H01004 Unspecified blepharitis left upper eyelid: Secondary | ICD-10-CM | POA: Diagnosis not present

## 2023-06-28 DIAGNOSIS — M25571 Pain in right ankle and joints of right foot: Secondary | ICD-10-CM | POA: Diagnosis not present

## 2023-06-28 DIAGNOSIS — M25572 Pain in left ankle and joints of left foot: Secondary | ICD-10-CM | POA: Diagnosis not present

## 2023-07-06 ENCOUNTER — Encounter: Payer: Self-pay | Admitting: Internal Medicine

## 2023-07-06 DIAGNOSIS — J3489 Other specified disorders of nose and nasal sinuses: Secondary | ICD-10-CM

## 2023-07-14 ENCOUNTER — Encounter: Payer: Self-pay | Admitting: Internal Medicine

## 2023-07-22 ENCOUNTER — Encounter: Payer: Self-pay | Admitting: Internal Medicine

## 2023-07-22 ENCOUNTER — Ambulatory Visit (INDEPENDENT_AMBULATORY_CARE_PROVIDER_SITE_OTHER): Admitting: Internal Medicine

## 2023-07-22 VITALS — BP 126/74 | HR 63 | Temp 98.7°F | Ht 71.0 in | Wt 165.0 lb

## 2023-07-22 DIAGNOSIS — E538 Deficiency of other specified B group vitamins: Secondary | ICD-10-CM | POA: Diagnosis not present

## 2023-07-22 DIAGNOSIS — R202 Paresthesia of skin: Secondary | ICD-10-CM

## 2023-07-22 DIAGNOSIS — L719 Rosacea, unspecified: Secondary | ICD-10-CM

## 2023-07-22 DIAGNOSIS — J341 Cyst and mucocele of nose and nasal sinus: Secondary | ICD-10-CM

## 2023-07-22 NOTE — Patient Instructions (Signed)
 Please continue all other medications as before, and refills have been done if requested.  Please have the pharmacy call with any other refills you may need.  Please continue your efforts at being more active, low cholesterol diet, and weight control.  You are otherwise up to date with prevention measures today.  Please keep your appointments with your specialists as you may have planned - ENT in June  You will be contacted regarding the referral for: CT sinus  We may need to consider Neurology referral if not improving

## 2023-07-22 NOTE — Progress Notes (Signed)
 Patient ID: Todd Sims, male   DOB: Nov 16, 1965, 58 y.o.   MRN: [id number]        Chief Complaint: follow up sinus symptoms, 1 hour episode paresthesias to hands and feet last wk, rosacea       HPI:  Todd Sims is a 58 y.o. male here with c/o 1 yr ongoing symtopms HA, pressure feeling in the head,  fatigue, foggy in the head, mentally less sharp, decreased sex drive, tinnitus, photosensitivity.  Also had an episode last week of bilateral hand and feet paresthesias at same time for 1 hour ten resolved.     Has had wt loss from 177 to 165 lbs with better diet, appetite ok.  Saw optho - eye exam ok, only refraction problems.  Has appt with ENT June 2025.  Tried zoloft recently but stopped after worsening insomnia with first few doses.    Also has mild worsening rosacea to facies but declines tx for now.  Has known mucous retention cysts to max sinus bilateral , incidentally already made ENT appt for June 2025.  Pt denies chest pain, increased sob or doe, wheezing, orthopnea, PND, increased LE swelling, palpitations, or syncope.   Pt denies polydipsia, polyuria, or new focal neuro s/s.  Pt denies fever, wt loss, night sweats, loss of appetite, or other constitutional symptoms   Wt Readings from Last 3 Encounters:  07/22/23 165 lb (74.8 kg)  04/02/23 177 lb (80.3 kg)  01/04/23 175 lb (79.4 kg)   BP Readings from Last 3 Encounters:  07/22/23 126/74  04/02/23 (!) 140/100  01/04/23 122/80         Past Medical History:  Diagnosis Date   ALLERGIC RHINITIS 08/26/2007   Anxiety 08/06/2010   Cervical neck pain with evidence of disc disease 08/06/2010   Chronic prostatitis 08/07/2011   Degenerative disk disease    lumbar and C- spine   Depression    GERD 04/18/2010   HYPERLIPIDEMIA 08/26/2007   Premature ejaculation 08/06/2010   Thoracic outlet syndrome 08/07/2011   Past Surgical History:  Procedure Laterality Date   COLONOSCOPY     THORACIC OUTLET SURGERY Right 2014    reports that he  has never smoked. He has never used smokeless tobacco. He reports current alcohol use of about 2.0 standard drinks of alcohol per week. He reports that he does not use drugs. family history includes Colon polyps in his father; Lung cancer in his mother. No Known Allergies Current Outpatient Medications on File Prior to Visit  Medication Sig Dispense Refill   olmesartan (BENICAR) 40 MG tablet Take 1 tablet (40 mg total) by mouth daily. 90 tablet 3   predniSONE (DELTASONE) 10 MG tablet 3 tabs by mouth per day for 3 days,2tabs per day for 3 days,1tab per day for 3 days (Patient not taking: Reported on 07/22/2023) 18 tablet 0   No current facility-administered medications on file prior to visit.        ROS:  All others reviewed and negative.  Objective        PE:  BP 126/74 (BP Location: Left Arm, Patient Position: Sitting, Cuff Size: Normal)   Pulse 63   Temp 98.7 F (37.1 C) (Oral)   Ht 5\' 11"  (1.803 m)   Wt 165 lb (74.8 kg)   SpO2 99%   BMI 23.01 kg/m                 Constitutional: Pt appears in NAD  HENT: Head: NCAT.                Right Ear: External ear normal.                 Left Ear: External ear normal.                Eyes: . Pupils are equal, round, and reactive to light. Conjunctivae and EOM are normal               Nose: without d/c or deformity               Neck: Neck supple. Gross normal ROM               Cardiovascular: Normal rate and regular rhythm.                 Pulmonary/Chest: Effort normal and breath sounds without rales or wheezing.                Abd:  Soft, NT, ND, + BS, no organomegaly               Neurological: Pt is alert. At baseline orientation, motor grossly intact               Skin: Skin is warm. Bilat facial rash, no other new lesions, LE edema - none               Psychiatric: Pt behavior is normal without agitation   Micro: none  Cardiac tracings I have personally interpreted today:  none  Pertinent Radiological findings  (summarize): none   Lab Results  Component Value Date   WBC 4.7 01/04/2023   HGB 14.3 01/04/2023   HCT 42.1 01/04/2023   PLT 146.0 (L) 01/04/2023   GLUCOSE 83 01/04/2023   CHOL 184 01/04/2023   TRIG 72.0 01/04/2023   HDL 49.50 01/04/2023   LDLDIRECT 122.0 12/03/2021   LDLCALC 120 (H) 01/04/2023   ALT 31 01/04/2023   AST 41 (H) 01/04/2023   NA 141 01/04/2023   K 4.3 01/04/2023   CL 108 01/04/2023   CREATININE 0.95 01/04/2023   BUN 20 01/04/2023   CO2 26 01/04/2023   TSH 0.86 01/04/2023   PSA 1.26 01/04/2023   INR 1.0 10/27/2019   HGBA1C 4.8 01/04/2023   Assessment/Plan:  Todd Sims is a 58 y.o. White or Caucasian [1] male with  has a past medical history of ALLERGIC RHINITIS (08/26/2007), Anxiety (08/06/2010), Cervical neck pain with evidence of disc disease (08/06/2010), Chronic prostatitis (08/07/2011), Degenerative disk disease, Depression, GERD (04/18/2010), HYPERLIPIDEMIA (08/26/2007), Premature ejaculation (08/06/2010), and Thoracic outlet syndrome (08/07/2011).  Mucous retention cyst of maxillary sinus Suspect this has to do with most of his ENT symptoms , for CT sinus, also f/u ENT as planned  Rosacea With mild recent worsening, declines tx for now  Intermittent paresthesia of hand and foot Etiology unclear, consider neurology if ENT exam is not definitive  B12 deficiency Lab Results  Component Value Date   VITAMINB12 216 01/04/2023   Low, to start oral replacement - b12 1000 mcg qd  Followup: Return if symptoms worsen or fail to improve.  Rosalia Colonel, MD 07/24/2023 2:53 PM [facility] [place] [facility] - [facility] Internal [facility]

## 2023-07-24 ENCOUNTER — Encounter: Payer: Self-pay | Admitting: Internal Medicine

## 2023-07-24 DIAGNOSIS — L719 Rosacea, unspecified: Secondary | ICD-10-CM | POA: Insufficient documentation

## 2023-07-24 DIAGNOSIS — R202 Paresthesia of skin: Secondary | ICD-10-CM | POA: Insufficient documentation

## 2023-07-24 DIAGNOSIS — J341 Cyst and mucocele of nose and nasal sinus: Secondary | ICD-10-CM | POA: Insufficient documentation

## 2023-07-24 NOTE — Assessment & Plan Note (Signed)
Lab Results  Component Value Date   VITAMINB12 216 01/04/2023   Low, to start oral replacement - b12 1000 mcg qd

## 2023-07-24 NOTE — Assessment & Plan Note (Signed)
 Suspect this has to do with most of his ENT symptoms , for CT sinus, also f/u ENT as planned

## 2023-07-24 NOTE — Assessment & Plan Note (Signed)
 Etiology unclear, consider neurology if ENT exam is not definitive

## 2023-07-24 NOTE — Assessment & Plan Note (Signed)
 With mild recent worsening, declines tx for now

## 2023-08-05 ENCOUNTER — Telehealth: Payer: Self-pay | Admitting: Internal Medicine

## 2023-08-05 NOTE — Telephone Encounter (Signed)
 Copied from CRM [id number]. Topic: Referral - Prior Authorization Question >> Aug 05, 2023  1:06 PM Aisha D wrote: Reason for CRM: Soyla Duverney with [facility] stated that the patient has an appointment on 4/28 and is wanting to see if prior authorization is needed. Soyla Duverney stated that the patient is having a CT scan, referral code ( [id number]). [place] left call back number [phone].  --  Please start PA for pt

## 2023-08-06 NOTE — Telephone Encounter (Signed)
 Copied from CRM [id number]. Topic: General - Other >> Aug 06, 2023 12:49 PM Tiffany S wrote: Reason for CRM: [facility] following up on Prior Auth request for patient please follow up [phone] ext [phone]  Prior Auth is needed by today by 2pm or patient appt will be canceled

## 2023-08-09 ENCOUNTER — Other Ambulatory Visit

## 2023-08-09 ENCOUNTER — Encounter: Payer: Self-pay | Admitting: Internal Medicine

## 2023-08-09 NOTE — Telephone Encounter (Signed)
 Sorry, I am not sure how to address this.  Ok to forward to [facility] -

## 2023-08-13 ENCOUNTER — Encounter: Payer: Self-pay | Admitting: Internal Medicine

## 2023-08-16 ENCOUNTER — Ambulatory Visit
Admission: RE | Admit: 2023-08-16 | Discharge: 2023-08-16 | Disposition: A | Discharge status: Home / Self Care | Source: Ambulatory Visit | Attending: Internal Medicine | Admitting: Internal Medicine

## 2023-08-16 DIAGNOSIS — J341 Cyst and mucocele of nose and nasal sinus: Secondary | ICD-10-CM | POA: Diagnosis not present

## 2023-08-19 ENCOUNTER — Encounter: Payer: Self-pay | Admitting: Internal Medicine

## 2023-08-19 DIAGNOSIS — J069 Acute upper respiratory infection, unspecified: Secondary | ICD-10-CM | POA: Diagnosis not present

## 2023-08-30 DIAGNOSIS — B880 Other acariasis: Secondary | ICD-10-CM | POA: Diagnosis not present

## 2023-08-30 DIAGNOSIS — H16223 Keratoconjunctivitis sicca, not specified as Sjogren's, bilateral: Secondary | ICD-10-CM | POA: Diagnosis not present

## 2023-09-15 ENCOUNTER — Encounter: Payer: Self-pay | Admitting: Neurology

## 2023-09-15 ENCOUNTER — Ambulatory Visit (INDEPENDENT_AMBULATORY_CARE_PROVIDER_SITE_OTHER): Admitting: Otolaryngology

## 2023-09-15 ENCOUNTER — Encounter (INDEPENDENT_AMBULATORY_CARE_PROVIDER_SITE_OTHER): Payer: Self-pay | Admitting: Otolaryngology

## 2023-09-15 VITALS — BP 130/77 | HR 75

## 2023-09-15 DIAGNOSIS — R0981 Nasal congestion: Secondary | ICD-10-CM

## 2023-09-15 DIAGNOSIS — R519 Headache, unspecified: Secondary | ICD-10-CM | POA: Diagnosis not present

## 2023-09-15 DIAGNOSIS — J3089 Other allergic rhinitis: Secondary | ICD-10-CM

## 2023-09-15 DIAGNOSIS — J343 Hypertrophy of nasal turbinates: Secondary | ICD-10-CM

## 2023-09-15 DIAGNOSIS — R413 Other amnesia: Secondary | ICD-10-CM

## 2023-09-15 DIAGNOSIS — R42 Dizziness and giddiness: Secondary | ICD-10-CM | POA: Diagnosis not present

## 2023-09-15 DIAGNOSIS — R448 Other symptoms and signs involving general sensations and perceptions: Secondary | ICD-10-CM | POA: Diagnosis not present

## 2023-09-15 DIAGNOSIS — J342 Deviated nasal septum: Secondary | ICD-10-CM

## 2023-09-15 DIAGNOSIS — R0982 Postnasal drip: Secondary | ICD-10-CM

## 2023-09-15 MED ORDER — LEVOCETIRIZINE DIHYDROCHLORIDE 5 MG PO TABS: 5.0000 mg | ORAL_TABLET | Freq: Every evening | ORAL | 3 refills | Status: DC

## 2023-09-15 MED ORDER — FLUTICASONE PROPIONATE 50 MCG/ACT NA SUSP: 2.0000 | Freq: Two times a day (BID) | NASAL | 6 refills | Status: AC

## 2023-09-15 NOTE — Progress Notes (Signed)
 ENT CONSULT:  Reason for Consult: facial pain and pressure (head pressure)   HPI: Discussed the use of AI scribe software for clinical note transcription with the patient, who gave verbal consent to proceed.  History of Present Illness Todd Sims is a 58 year old male who presents with persistent head pressure and discomfort around his eyes.   Since December, he has been experiencing a persistent pressure sensation in his head, accompanied by eye discomfort. He has increased sensitivity to glare and auditory stimuli, and occasionally feels as though his cognitive function is impaired, affecting his ability to focus. No history of headaches or migraines. He occasionally experiences vertigo, about once a year, which he manages with positional maneuvers (Epley) he is able to do on his own with sx relief.  He has a history of tinnitus for which he uses hearing aids occasionally. He was previously evaluated for high blood pressure and is on medication, which has helped with the tinnitus but not the head pressure.  He experienced an episode of transient global amnesia in the past, which was evaluated for stroke with no findings. He has not been tested for allergies but experiences mild hay fever symptoms seasonally, for which he occasionally takes Zyrtec. He prefers to avoid medication unless necessary.   Records Reviewed:  [facility] 08/19/23 Patient is a 58 y.o. male here c/w sore throat x 1 day. Admits nasal congestion, rhinorrhea, cough. He would like strep testing. No known exposures. No advil or tylenol  today.   Gian was seen today for sorethroat(st). Diagnoses and all orders for this visit: Viral upper respiratory tract infection (Primary) - POCT rapid strep A manually resulted     Past Medical History:  Diagnosis Date   ALLERGIC RHINITIS 08/26/2007   Anxiety 08/06/2010   Cervical neck pain with evidence of disc disease 08/06/2010   Chronic prostatitis 08/07/2011   Degenerative disk  disease    lumbar and C- spine   Depression    GERD 04/18/2010   HYPERLIPIDEMIA 08/26/2007   Premature ejaculation 08/06/2010   Thoracic outlet syndrome 08/07/2011    Past Surgical History:  Procedure Laterality Date   COLONOSCOPY     THORACIC OUTLET SURGERY Right 2014    Family History  Problem Relation Age of Onset   Lung cancer Mother    Colon polyps Father    Colon cancer Neg Hx    Esophageal cancer Neg Hx    Liver cancer Neg Hx    Pancreatic cancer Neg Hx    Rectal cancer Neg Hx    Stomach cancer Neg Hx     Social History:  reports that he has never smoked. He has never used smokeless tobacco. He reports current alcohol use of about 2.0 standard drinks of alcohol per week. He reports that he does not use drugs.  Allergies: No Known Allergies  Medications: I have reviewed the patient's current medications.  The PMH, PSH, Medications, Allergies, and SH were reviewed and updated.  ROS: Constitutional: Negative for fever, weight loss and weight gain. Cardiovascular: Negative for chest pain and dyspnea on exertion. Respiratory: Is not experiencing shortness of breath at rest. Gastrointestinal: Negative for nausea and vomiting. Neurological: Negative for headaches. Psychiatric: The patient is not nervous/anxious  There were no vitals taken for this visit. There is no height or weight on file to calculate BMI.  PHYSICAL EXAM:  Exam: General: Well-developed, well-nourished Respiratory Respiratory effort: Equal inspiration and expiration without stridor Cardiovascular Peripheral Vascular: Warm extremities with equal color/perfusion Eyes:  No nystagmus with equal extraocular motion bilaterally Neuro/Psych/Balance: Patient oriented to person, place, and time; Appropriate mood and affect; Gait is intact with no imbalance; Cranial nerves I-XII are intact Head and Face Inspection: Normocephalic and atraumatic without mass or lesion Palpation: Facial skeleton intact  without bony stepoffs Salivary Glands: No mass or tenderness Facial Strength: Facial motility symmetric and full bilaterally ENT Pinna: External ear intact and fully developed External canal: Canal is patent with intact skin Tympanic Membrane: Clear and mobile External Nose: No scar or anatomic deformity Internal Nose: Septum is S-shaped and deviated to the right on anterior rhinoscopy. No polyp, or purulence. Mucosal edema and erythema present.  Bilateral inferior turbinate hypertrophy.  Lips, Teeth, and gums: Mucosa and teeth intact and viable TMJ: No pain to palpation with full mobility Oral cavity/oropharynx: No erythema or exudate, no lesions present Neck Neck and Trachea: Midline trachea without mass or lesion Thyroid : No mass or nodularity Lymphatics: No lymphadenopathy   Studies Reviewed: CT sinuses 08/16/23 Report/interpretation not available at the time of the visit   Reviewed images, paranasal sinuses clear, bilateral mucus retention cysts in maxillary sinuses, S-shaped septum    Assessment/Plan: No diagnosis found.  Assessment and Plan Assessment & Plan Head pressure and sensitivity Chronic head pressure and sensitivity to light at times, since December. CT sinuses with clear paranasal sinuses and two non-obstructive small mucous retention cysts. Symptoms atypical for sinus disease or allergies/nasal congestion. Differential includes atypical headaches. He also reports an episode of amnesia. Neurology referral suggested. - Refer to neurology for evaluation and management of atypical headaches and episode of amnesia   Transient global amnesia Single past episode of transient global amnesia with no recurrence. He had negative workup for stroke in the hospital. Neurology referral recommended for evaluation in context of current symptoms. - Refer to neurology for evaluation of transient global amnesia and current symptoms.  Vertigo Occasional vertigo, approximately once  a year, managed effectively with positional maneuvers like Epley with relief - likely BPPV - observe for now  Septal deviation ITH - noted on anterior rhinoscopy and CT scan - he denies nasal congestion and nasal obstruction - will monitor for now - Flonase and Xyzal trial - Rx sent       Thank you for allowing me to participate in the care of this patient. Please do not hesitate to contact me with any questions or concerns.   Artice Last, MD Otolaryngology [facility] Phone: [phone] Fax: [phone]    09/15/2023, 12:25 PM

## 2023-09-17 ENCOUNTER — Ambulatory Visit: Payer: Self-pay | Admitting: Internal Medicine

## 2023-10-06 ENCOUNTER — Encounter: Payer: Self-pay | Admitting: Internal Medicine

## 2023-10-06 ENCOUNTER — Ambulatory Visit: Payer: BC Managed Care – PPO | Admitting: Internal Medicine

## 2023-10-13 ENCOUNTER — Ambulatory Visit: Admitting: Internal Medicine

## 2023-10-18 ENCOUNTER — Encounter: Payer: Self-pay | Admitting: Internal Medicine

## 2023-10-18 DIAGNOSIS — G4710 Hypersomnia, unspecified: Secondary | ICD-10-CM

## 2023-10-21 ENCOUNTER — Ambulatory Visit: Admitting: Sleep Medicine

## 2023-10-21 ENCOUNTER — Encounter: Payer: Self-pay | Admitting: Sleep Medicine

## 2023-10-21 VITALS — BP 108/60 | HR 63 | Temp 98.2°F | Ht 71.0 in | Wt 159.6 lb

## 2023-10-21 DIAGNOSIS — I10 Essential (primary) hypertension: Secondary | ICD-10-CM | POA: Diagnosis not present

## 2023-10-21 DIAGNOSIS — G4733 Obstructive sleep apnea (adult) (pediatric): Secondary | ICD-10-CM

## 2023-10-21 NOTE — Patient Instructions (Signed)
 SABRA

## 2023-10-21 NOTE — Progress Notes (Signed)
 Name:Todd Sims MRN: [id number] DOB: August 08, 1965   CHIEF COMPLAINT:  EXCESSIVE DAYTIME SLEEPINESS   HISTORY OF PRESENT ILLNESS:  [name] is a 58 y.o. w/ a h/o HTN who present for c/o loud snoring and excessive daytime sleepiness which has been present for several years. Reports nocturnal awakenings due to nocturia, and occasionally has difficulty falling back to sleep. Reports a 25 lb weight loss over the last few years. Admits to morning headaches. Denies RLS symptoms, dream enactment, cataplexy, hypnagogic or hypnapompic hallucinations. Denies a family history of sleep apnea. Denies drowsy driving. Drinks 1-2 cups of coffee and 1 cup of tea or soda daily, drinks 4 beers weekly, denies tobacco or illicit drug use.   Bedtime 9:30-10:30 pm Sleep onset 30 mins Rise time 6-6:30 am   EPWORTH SLEEP SCORE 5    10/21/2023    1:00 PM  Results of the Epworth flowsheet  Sitting and reading 1  Watching TV 1  Sitting, inactive in a public place (e.g. a theatre or a meeting) 0  As a passenger in a car for an hour without a break 0  Lying down to rest in the afternoon when circumstances permit 2  Sitting and talking to someone 0  Sitting quietly after a lunch without alcohol 1  In a car, while stopped for a few minutes in traffic 0  Total score 5    PAST MEDICAL HISTORY :   has a past medical history of ALLERGIC RHINITIS (08/26/2007), Allergy, Anxiety (08/06/2010), Arthritis, Cervical neck pain with evidence of disc disease (08/06/2010), Chronic prostatitis (08/07/2011), Degenerative disk disease, Depression, GERD (04/18/2010), HYPERLIPIDEMIA (08/26/2007), Premature ejaculation (08/06/2010), and Thoracic outlet syndrome (08/07/2011).  has a past surgical history that includes Thoracic outlet surgery (Right, 2014) and Colonoscopy. Prior to Admission medications   Medication Sig Start Date End Date Taking? Authorizing Provider  fluticasone  (FLONASE ) 50 MCG/ACT nasal spray Place 2  sprays into both nostrils 2 (two) times daily. Patient taking differently: Place 2 sprays into both nostrils 2 (two) times daily as needed for allergies or rhinitis. 09/15/23  Yes Soldatova, Liuba, MD  levocetirizine (XYZAL  ALLERGY 24HR) 5 MG tablet Take 1 tablet (5 mg total) by mouth every evening. Patient taking differently: Take 5 mg by mouth as needed for allergies. 09/15/23  Yes Soldatova, Liuba, MD  olmesartan  (BENICAR ) 40 MG tablet Take 1 tablet (40 mg total) by mouth daily. Patient taking differently: Take 1 tablet (40 mg total) by mouth daily. 06/10/23  Yes Norleen Lynwood ORN, MD  predniSONE  (DELTASONE ) 10 MG tablet 3 tabs by mouth per day for 3 days,2tabs per day for 3 days,1tab per day for 3 days Patient not taking: Reported on 10/21/2023 05/24/23   Norleen Lynwood ORN, MD   Allergies  Allergen Reactions   Tree Extract Cough    rhinitis    FAMILY HISTORY:  family history includes Colon polyps in his father; Lung cancer in his mother. SOCIAL HISTORY:  reports that he has never smoked. He has never used smokeless tobacco. He reports current alcohol use of about 2.0 standard drinks of alcohol per week. He reports that he does not use drugs.   Review of Systems:  Gen:  Denies  fever, sweats, chills weight loss  HEENT: Denies blurred vision, double vision, ear pain, eye pain, hearing loss, nose bleeds, sore throat Cardiac:  No dizziness, chest pain or heaviness, chest tightness,edema, No JVD Resp:   No cough, -sputum production, -shortness of breath,-wheezing, -hemoptysis,  Gi: Denies swallowing difficulty, stomach pain, nausea or vomiting, diarrhea, constipation, bowel incontinence Gu:  Denies bladder incontinence, burning urine Ext:   Denies Joint pain, stiffness or swelling Skin: Denies  skin rash, easy bruising or bleeding or hives Endoc:  Denies polyuria, polydipsia , polyphagia or weight change Psych:   Denies depression, insomnia or hallucinations  Other:  All other systems  negative  VITAL SIGNS: BP 108/60 (BP Location: Left Arm, Patient Position: Sitting, Cuff Size: Normal)   Pulse 63   Temp 98.2 F (36.8 C) (Oral)   Ht 5' 11 (1.803 m)   Wt 159 lb 9.6 oz (72.4 kg)   SpO2 96%   BMI 22.26 kg/m    Physical Examination:   General Appearance: No distress  EYES PERRLA, EOM intact.   NECK Supple, No JVD Pulmonary: normal breath sounds, No wheezing.  CardiovascularNormal S1,S2.  No m/r/g.   Abdomen: Benign, Soft, non-tender. Skin:   warm, no rashes, no ecchymosis  Extremities: normal, no cyanosis, clubbing. Neuro:without focal findings,  speech normal  PSYCHIATRIC: Mood, affect within normal limits.   ASSESSMENT AND PLAN  OSA I suspect that OSA is likely present due to clinical presentation. Discussed the consequences of untreated sleep apnea. Advised not to drive drowsy for safety of patient and others. Will complete further evaluation with a home sleep study and follow up to review results.    HTN Stable, on current management. Following with PCP.    MEDICATION ADJUSTMENTS/LABS AND TESTS ORDERED: Recommend Sleep Study   Patient  satisfied with Plan of action and management. All questions answered  Follow up to review HST results and treatment plan.   I spent a total of 47 minutes reviewing chart data, face-to-face evaluation with the patient, counseling and coordination of care as detailed above.    Amberia Bayless, M.D.  Sleep Medicine [facility]

## 2023-11-04 NOTE — Progress Notes (Deleted)
 Initial neurology clinic note  Todd Sims MRN: [id number] DOB: 1965-05-22  Referring provider: Okey Burns, MD  Primary care provider: Norleen Lynwood ORN, MD  Reason for consult:  headaches  Subjective:  This is Todd Sims, a 58 y.o. ***-handed male with a medical history of transient global amnesia (10/2019), degenerative disk disease (cervical and lumbar), HLD, GERD, anxiety who presents to neurology clinic with ***. The patient is accompanied by ***.  *** Pressure sensation in head with eye discomfort Photophobia and phonophobia (?) No prior hx of headache Cognitive function also impaired (with headache or separate?) Vertigo about once per year Does Epley with relief  Hx of tinnitus with hearing aids used sometimes  Transient global amnesia in the past (2021)  Any supplements such as B12 or vit D?  Smoker: OCP use: Caffiene use: EtOH use: Restrictive diet: Family history:   MEDICATIONS:  Outpatient Encounter Medications as of 11/18/2023  Medication Sig   fluticasone  (FLONASE ) 50 MCG/ACT nasal spray Place 2 sprays into both nostrils 2 (two) times daily. (Patient taking differently: Place 2 sprays into both nostrils 2 (two) times daily as needed for allergies or rhinitis.)   levocetirizine (XYZAL  ALLERGY 24HR) 5 MG tablet Take 1 tablet (5 mg total) by mouth every evening. (Patient taking differently: Take 5 mg by mouth as needed for allergies.)   olmesartan  (BENICAR ) 40 MG tablet Take 1 tablet (40 mg total) by mouth daily. (Patient taking differently: Take 1 tablet (40 mg total) by mouth daily.)   predniSONE  (DELTASONE ) 10 MG tablet 3 tabs by mouth per day for 3 days,2tabs per day for 3 days,1tab per day for 3 days (Patient not taking: Reported on 10/21/2023)   No facility-administered encounter medications on file as of 11/18/2023.    PAST MEDICAL HISTORY: Past Medical History:  Diagnosis Date   ALLERGIC RHINITIS 08/26/2007   Allergy    Anxiety  08/06/2010   Arthritis    Cervical neck pain with evidence of disc disease 08/06/2010   Chronic prostatitis 08/07/2011   Degenerative disk disease    lumbar and C- spine   Depression    GERD 04/18/2010   HYPERLIPIDEMIA 08/26/2007   Premature ejaculation 08/06/2010   Thoracic outlet syndrome 08/07/2011    PAST SURGICAL HISTORY: Past Surgical History:  Procedure Laterality Date   COLONOSCOPY     THORACIC OUTLET SURGERY Right 2014    ALLERGIES: Allergies  Allergen Reactions   Tree Extract Cough    rhinitis    FAMILY HISTORY: Family History  Problem Relation Age of Onset   Lung cancer Mother    Colon polyps Father    Colon cancer Neg Hx    Esophageal cancer Neg Hx    Liver cancer Neg Hx    Pancreatic cancer Neg Hx    Rectal cancer Neg Hx    Stomach cancer Neg Hx     SOCIAL HISTORY: Social History   Tobacco Use   Smoking status: Never   Smokeless tobacco: Never  Vaping Use   Vaping status: Never Used  Substance Use Topics   Alcohol use: Yes    Alcohol/week: 2.0 standard drinks of alcohol    Types: 2 Cans of beer per week   Drug use: No   Social History   Social History Narrative   Married with 3 children    Objective:  Vital Signs:  There were no vitals taken for this visit.  ***  Labs and Imaging review: Internal labs: 01/04/23: HbA1c: 4.8 Lipid  panel: tChol 184, LDL 120, TG 72 TSH wnl B12: 216 Vit D: 20.39  06/12/22: ESR 14 CRP < 1.0  External labs: ***  Imaging/Procedures: MRI cervical spine wo contrast (01/24/22): Findings: Slightly heterogeneous appearance bone marrow of the  clivus and minimal decreased signal intensity bone marrow of the  cervical spine may be related to the patient's habitus. Result of  anemia not entirely excluded.  No history of cancer to suggest  infiltrative process.   Visualized intracranial structures and cervical medullary junction  unremarkable.  No focal cervical cord signal abnormality.   Both  vertebral arteries are patent.  Visualized paravertebral  structures unremarkable.   C2-3:  Negative.   C3-4:  Right-sided uncinate bony overgrowth with mild to slightly  moderate right foraminal narrowing.  Minimal impression upon the  right lateral ventral aspect of the thecal sac and possibly the  right ventral nerve root.   C4-5:  Minimal bulge.  Minimal left-sided uncinate bony overgrowth.   C5-6:  Mild bulge greater to the left.  Uncinate bony overgrowth  greater left.  Very mild left-sided and minimal right-sided  foraminal narrowing.  Minimal left-sided spinal stenosis.   C6-7:  Mild left uncinate hypertrophy with very mild left-sided  foraminal narrowing.   C7-T1:  Minimal facet joint degenerative changes.   IMPRESSION:  In this patient with right-sided symptoms, most notable right-sided  findings are at the C3-4 level as detailed above.   CTA head and neck (10/27/19): IMPRESSION: 1. Minimal intracranial atherosclerosis without large vessel occlusion or significant proximal stenosis. 2. Widely patent cervical carotid and vertebral arteries.  MRI brain wo contrast (10/27/19): IMPRESSION: 1. No acute intracranial abnormality. 2. Absent septum pellucidum, otherwise unremarkable appearance of the brain.  EEG (10/27/19): Description: The posterior dominant rhythm consists of 10 Hz activity of moderate voltage (25-35 uV) seen predominantly in posterior head regions, symmetric and reactive to eye opening and eye closing. Drowsiness was characterized by attenuation of the posterior background rhythm. Sleep was characterized by vertex waves, sleep spindles (12 to 14 Hz), maximal frontocentral region.  Hyperventilation did not show any EEG change.  Physiology photic driving was seen during photic stimulation.     IMPRESSION: This study is within normal limits. No seizures or epileptiform discharges were seen throughout the recording.  CT sinus wo contrast  (08/16/23): IMPRESSION: Bilateral maxillary sinus retention cysts. Patent sinus drainage pathways. The other paranasal sinuses are clear. ***  Assessment/Plan:  Todd Sims is a 58 y.o. male who presents for evaluation of ***. *** has a relevant medical history of ***. *** neurological examination is pertinent for ***. Available diagnostic data is significant for ***. This constellation of symptoms and objective data would most likely localize to ***. ***  PLAN: -Blood work: *** ***  -Return to clinic ***  The impression above as well as the plan as outlined below were extensively discussed with the patient (in the company of ***) who voiced understanding. All questions were answered to their satisfaction.  The patient was counseled on pertinent fall precautions per the printed material provided today, and as noted under the Patient Instructions section below.***  When available, results of the above investigations and possible further recommendations will be communicated to the patient via telephone/MyChart. Patient to call office if not contacted after expected testing turnaround time.   Total time spent reviewing records, interview, history/exam, documentation, and coordination of care on day of encounter:  *** min   Thank you for allowing me to participate in  patient's care.  If I can answer any additional questions, I would be pleased to do so.  Venetia Potters, MD   CC: Norleen Lynwood ORN, MD [address] [place] [address]  CC: Referring provider: Okey Burns, MD [address], [address] [place],  [place] [address]

## 2023-11-07 ENCOUNTER — Encounter

## 2023-11-07 DIAGNOSIS — G4733 Obstructive sleep apnea (adult) (pediatric): Secondary | ICD-10-CM

## 2023-11-09 NOTE — Progress Notes (Unsigned)
 Initial neurology clinic note  Todd Sims MRN: [id number] DOB: [birth year]  Referring provider: Okey Burns, MD  Primary care provider: Norleen Lynwood ORN, MD  Reason for consult:  headaches  Subjective:  This is Todd Sims, a 59 y.o. ***-handed male with a medical history of transient global amnesia (10/2019), degenerative disk disease (cervical and lumbar), HLD, GERD, anxiety who presents to neurology clinic with ***. The patient is accompanied by ***.  *** Pressure sensation in head with eye discomfort Photophobia and phonophobia (?) No prior hx of headache Cognitive function also impaired (with headache or separate?) Vertigo about once per year Does Epley with relief  Hx of tinnitus with hearing aids used sometimes  Transient global amnesia in the past (2021)  Any supplements such as B12 or vit D?  Smoker: OCP use: Caffiene use: EtOH use: Restrictive diet: Family history:   MEDICATIONS:  Outpatient Encounter Medications as of 11/11/2023  Medication Sig   fluticasone  (FLONASE ) 50 MCG/ACT nasal spray Place 2 sprays into both nostrils 2 (two) times daily. (Patient taking differently: Place 2 sprays into both nostrils 2 (two) times daily as needed for allergies or rhinitis.)   levocetirizine (XYZAL  ALLERGY 24HR) 5 MG tablet Take 1 tablet (5 mg total) by mouth every evening. (Patient taking differently: Take 5 mg by mouth as needed for allergies.)   olmesartan  (BENICAR ) 40 MG tablet Take 1 tablet (40 mg total) by mouth daily. (Patient taking differently: Take 1 tablet (40 mg total) by mouth daily.)   predniSONE  (DELTASONE ) 10 MG tablet 3 tabs by mouth per day for 3 days,2tabs per day for 3 days,1tab per day for 3 days (Patient not taking: Reported on 10/21/2023)   No facility-administered encounter medications on file as of 11/11/2023.    PAST MEDICAL HISTORY: Past Medical History:  Diagnosis Date   ALLERGIC RHINITIS 08/26/2007   Allergy    Anxiety  08/06/2010   Arthritis    Cervical neck pain with evidence of disc disease 08/06/2010   Chronic prostatitis 08/07/2011   Degenerative disk disease    lumbar and C- spine   Depression    GERD 04/18/2010   HYPERLIPIDEMIA 08/26/2007   Premature ejaculation 08/06/2010   Thoracic outlet syndrome 08/07/2011    PAST SURGICAL HISTORY: Past Surgical History:  Procedure Laterality Date   COLONOSCOPY     THORACIC OUTLET SURGERY Right 2014    ALLERGIES: Allergies  Allergen Reactions   Tree Extract Cough    rhinitis    FAMILY HISTORY: Family History  Problem Relation Age of Onset   Lung cancer Mother    Colon polyps Father    Colon cancer Neg Hx    Esophageal cancer Neg Hx    Liver cancer Neg Hx    Pancreatic cancer Neg Hx    Rectal cancer Neg Hx    Stomach cancer Neg Hx     SOCIAL HISTORY: Social History   Tobacco Use   Smoking status: Never   Smokeless tobacco: Never  Vaping Use   Vaping status: Never Used  Substance Use Topics   Alcohol use: Yes    Alcohol/week: 2.0 standard drinks of alcohol    Types: 2 Cans of beer per week   Drug use: No   Social History   Social History Narrative   Married with 3 children    Objective:  Vital Signs:  There were no vitals taken for this visit.  ***  Labs and Imaging review: Internal labs: 01/04/23: HbA1c: 4.8 Lipid  panel: tChol 184, LDL 120, TG 72 TSH wnl B12: 216 Vit D: 20.39  06/12/22: ESR 14 CRP < 1.0  External labs: ***  Imaging/Procedures: MRI cervical spine wo contrast (01/24/22): Findings: Slightly heterogeneous appearance bone marrow of the  clivus and minimal decreased signal intensity bone marrow of the  cervical spine may be related to the patient's habitus. Result of  anemia not entirely excluded.  No history of cancer to suggest  infiltrative process.   Visualized intracranial structures and cervical medullary junction  unremarkable.  No focal cervical cord signal abnormality.   Both  vertebral arteries are patent.  Visualized paravertebral  structures unremarkable.   C2-3:  Negative.   C3-4:  Right-sided uncinate bony overgrowth with mild to slightly  moderate right foraminal narrowing.  Minimal impression upon the  right lateral ventral aspect of the thecal sac and possibly the  right ventral nerve root.   C4-5:  Minimal bulge.  Minimal left-sided uncinate bony overgrowth.   C5-6:  Mild bulge greater to the left.  Uncinate bony overgrowth  greater left.  Very mild left-sided and minimal right-sided  foraminal narrowing.  Minimal left-sided spinal stenosis.   C6-7:  Mild left uncinate hypertrophy with very mild left-sided  foraminal narrowing.   C7-T1:  Minimal facet joint degenerative changes.   IMPRESSION:  In this patient with right-sided symptoms, most notable right-sided  findings are at the C3-4 level as detailed above.   CTA head and neck (10/27/19): IMPRESSION: 1. Minimal intracranial atherosclerosis without large vessel occlusion or significant proximal stenosis. 2. Widely patent cervical carotid and vertebral arteries.  MRI brain wo contrast (10/27/19): IMPRESSION: 1. No acute intracranial abnormality. 2. Absent septum pellucidum, otherwise unremarkable appearance of the brain.  EEG (10/27/19): Description: The posterior dominant rhythm consists of 10 Hz activity of moderate voltage (25-35 uV) seen predominantly in posterior head regions, symmetric and reactive to eye opening and eye closing. Drowsiness was characterized by attenuation of the posterior background rhythm. Sleep was characterized by vertex waves, sleep spindles (12 to 14 Hz), maximal frontocentral region.  Hyperventilation did not show any EEG change.  Physiology photic driving was seen during photic stimulation.     IMPRESSION: This study is within normal limits. No seizures or epileptiform discharges were seen throughout the recording.  CT sinus wo contrast  (08/16/23): IMPRESSION: Bilateral maxillary sinus retention cysts. Patent sinus drainage pathways. The other paranasal sinuses are clear. ***  Assessment/Plan:  Todd Sims is a 58 y.o. male who presents for evaluation of ***. *** has a relevant medical history of ***. *** neurological examination is pertinent for ***. Available diagnostic data is significant for ***. This constellation of symptoms and objective data would most likely localize to ***. ***  PLAN: -Blood work: *** ***  -Return to clinic ***  The impression above as well as the plan as outlined below were extensively discussed with the patient (in the company of ***) who voiced understanding. All questions were answered to their satisfaction.  The patient was counseled on pertinent fall precautions per the printed material provided today, and as noted under the Patient Instructions section below.***  When available, results of the above investigations and possible further recommendations will be communicated to the patient via telephone/MyChart. Patient to call office if not contacted after expected testing turnaround time.   Total time spent reviewing records, interview, history/exam, documentation, and coordination of care on day of encounter:  *** min   Thank you for allowing me to participate in  patient's care.  If I can answer any additional questions, I would be pleased to do so.  Todd Potters, MD   CC: Norleen Lynwood ORN, MD [address] [place] [address]  CC: Referring provider: Okey Burns, MD [address], [address] [place],  [place] [address]

## 2023-11-11 ENCOUNTER — Ambulatory Visit (INDEPENDENT_AMBULATORY_CARE_PROVIDER_SITE_OTHER): Admitting: Neurology

## 2023-11-11 ENCOUNTER — Other Ambulatory Visit

## 2023-11-11 ENCOUNTER — Encounter: Payer: Self-pay | Admitting: Neurology

## 2023-11-11 VITALS — BP 142/87 | HR 78 | Ht 71.0 in | Wt 159.0 lb

## 2023-11-11 DIAGNOSIS — E559 Vitamin D deficiency, unspecified: Secondary | ICD-10-CM | POA: Diagnosis not present

## 2023-11-11 DIAGNOSIS — R519 Headache, unspecified: Secondary | ICD-10-CM

## 2023-11-11 DIAGNOSIS — Z8781 Personal history of (healed) traumatic fracture: Secondary | ICD-10-CM | POA: Diagnosis not present

## 2023-11-11 DIAGNOSIS — R4189 Other symptoms and signs involving cognitive functions and awareness: Secondary | ICD-10-CM

## 2023-11-11 DIAGNOSIS — G4733 Obstructive sleep apnea (adult) (pediatric): Secondary | ICD-10-CM | POA: Diagnosis not present

## 2023-11-11 DIAGNOSIS — E538 Deficiency of other specified B group vitamins: Secondary | ICD-10-CM

## 2023-11-11 NOTE — Patient Instructions (Signed)
 I saw you today for head pressure, cognitive changes, and fatigue. I wonder if your symptoms are due to the suspected sleep apnea. We will see what your sleep test shows, but I bet treating this would improve all of your symptoms.  You also had lab work in 10/2022 that showed your vitamin B12 and vitamin D  were low. These could also cause these symptoms. I will recheck these levels and some other vitamins today to make sure there are not other contributing causes.  Given you history of transient global amnesia and skull fracture, it is also reasonable to repeat an MRI brain to make sure there is nothing else of concern.  I will be in touch when I have your results to discuss next steps.  I will see you back in clinic in about 3 months to check your progress.  Please let me know if you have any questions or concerns in the meantime.   The physicians and staff at [facility] are committed to providing excellent care. You may receive a survey requesting feedback about your experience at our office. We strive to receive very good responses to the survey questions. If you feel that your experience would prevent you from giving the office a very good  response, please contact our office to try to remedy the situation. We may be reached at [phone]. Thank you for taking the time out of your busy day to complete the survey.  Venetia Potters, MD [facility]

## 2023-11-13 ENCOUNTER — Encounter: Payer: Self-pay | Admitting: Neurology

## 2023-11-13 ENCOUNTER — Encounter: Payer: Self-pay | Admitting: Internal Medicine

## 2023-11-15 LAB — MAGNESIUM: Magnesium: 2 mg/dL (ref 1.5–2.5)

## 2023-11-15 LAB — VITAMIN B1: Vitamin B1 (Thiamine): 18 nmol/L (ref 8–30)

## 2023-11-15 LAB — VITAMIN D 25 HYDROXY (VIT D DEFICIENCY, FRACTURES): Vit D, 25-Hydroxy: 38 ng/mL (ref 30–100)

## 2023-11-15 LAB — VITAMIN B12: Vitamin B-12: 287 pg/mL (ref 200–1100)

## 2023-11-15 LAB — METHYLMALONIC ACID, SERUM: Methylmalonic Acid, Quant: 365 nmol/L — ABNORMAL HIGH (ref 55–335)

## 2023-11-16 ENCOUNTER — Ambulatory Visit: Payer: Self-pay | Admitting: Neurology

## 2023-11-18 ENCOUNTER — Encounter: Payer: Self-pay | Admitting: Neurology

## 2023-11-18 ENCOUNTER — Ambulatory Visit: Admitting: Neurology

## 2023-11-22 DIAGNOSIS — R069 Unspecified abnormalities of breathing: Secondary | ICD-10-CM | POA: Diagnosis not present

## 2023-11-23 ENCOUNTER — Ambulatory Visit: Payer: Self-pay

## 2023-11-23 DIAGNOSIS — G4733 Obstructive sleep apnea (adult) (pediatric): Secondary | ICD-10-CM

## 2023-11-24 ENCOUNTER — Ambulatory Visit
Admission: RE | Admit: 2023-11-24 | Discharge: 2023-11-24 | Disposition: A | Discharge status: Home / Self Care | Source: Ambulatory Visit | Attending: Neurology | Admitting: Neurology

## 2023-11-24 DIAGNOSIS — R4189 Other symptoms and signs involving cognitive functions and awareness: Secondary | ICD-10-CM | POA: Diagnosis not present

## 2023-11-24 DIAGNOSIS — R519 Headache, unspecified: Secondary | ICD-10-CM

## 2023-11-24 DIAGNOSIS — Z8781 Personal history of (healed) traumatic fracture: Secondary | ICD-10-CM

## 2023-11-24 MED ORDER — GADOPICLENOL 0.5 MMOL/ML IV SOLN
7.0000 mL | Freq: Once | INTRAVENOUS | Status: AC | PRN
  Administered 2023-11-24 (×2): 7 mL via INTRAVENOUS

## 2023-12-02 DIAGNOSIS — G4733 Obstructive sleep apnea (adult) (pediatric): Secondary | ICD-10-CM | POA: Diagnosis not present

## 2023-12-20 ENCOUNTER — Telehealth: Payer: Self-pay | Admitting: Sleep Medicine

## 2023-12-20 NOTE — Telephone Encounter (Signed)
 LVMTCB to schedule CPAP compliance between 01/08/2024 to 03/07/2024.

## 2024-01-02 DIAGNOSIS — G4733 Obstructive sleep apnea (adult) (pediatric): Secondary | ICD-10-CM | POA: Diagnosis not present

## 2024-01-10 ENCOUNTER — Encounter: Payer: Self-pay | Admitting: Internal Medicine

## 2024-01-10 ENCOUNTER — Ambulatory Visit: Payer: BC Managed Care – PPO | Admitting: Internal Medicine

## 2024-01-10 ENCOUNTER — Ambulatory Visit: Payer: Self-pay | Admitting: Internal Medicine

## 2024-01-10 VITALS — BP 120/82 | HR 56 | Temp 98.6°F | Ht 71.0 in | Wt 159.0 lb

## 2024-01-10 DIAGNOSIS — Z125 Encounter for screening for malignant neoplasm of prostate: Secondary | ICD-10-CM

## 2024-01-10 DIAGNOSIS — E7800 Pure hypercholesterolemia, unspecified: Secondary | ICD-10-CM | POA: Diagnosis not present

## 2024-01-10 DIAGNOSIS — E559 Vitamin D deficiency, unspecified: Secondary | ICD-10-CM | POA: Diagnosis not present

## 2024-01-10 DIAGNOSIS — R519 Headache, unspecified: Secondary | ICD-10-CM

## 2024-01-10 DIAGNOSIS — E538 Deficiency of other specified B group vitamins: Secondary | ICD-10-CM | POA: Diagnosis not present

## 2024-01-10 DIAGNOSIS — J341 Cyst and mucocele of nose and nasal sinus: Secondary | ICD-10-CM

## 2024-01-10 DIAGNOSIS — I10 Essential (primary) hypertension: Secondary | ICD-10-CM

## 2024-01-10 DIAGNOSIS — R739 Hyperglycemia, unspecified: Secondary | ICD-10-CM

## 2024-01-10 DIAGNOSIS — Z0001 Encounter for general adult medical examination with abnormal findings: Secondary | ICD-10-CM | POA: Diagnosis not present

## 2024-01-10 DIAGNOSIS — M542 Cervicalgia: Secondary | ICD-10-CM

## 2024-01-10 DIAGNOSIS — M5090 Cervical disc disorder, unspecified, unspecified cervical region: Secondary | ICD-10-CM

## 2024-01-10 DIAGNOSIS — R21 Rash and other nonspecific skin eruption: Secondary | ICD-10-CM

## 2024-01-10 DIAGNOSIS — M25511 Pain in right shoulder: Secondary | ICD-10-CM | POA: Diagnosis not present

## 2024-01-10 DIAGNOSIS — F32A Depression, unspecified: Secondary | ICD-10-CM

## 2024-01-10 DIAGNOSIS — M25519 Pain in unspecified shoulder: Secondary | ICD-10-CM

## 2024-01-10 LAB — URINALYSIS, ROUTINE W REFLEX MICROSCOPIC
Bilirubin Urine: NEGATIVE
Hgb urine dipstick: NEGATIVE
Ketones, ur: NEGATIVE
Leukocytes,Ua: NEGATIVE
Nitrite: NEGATIVE
RBC / HPF: NONE SEEN (ref 0–?)
Specific Gravity, Urine: <=1.005 — AB (ref 1.000–1.030)
Total Protein, Urine: NEGATIVE
Urine Glucose: NEGATIVE
Urobilinogen, UA: 0.2 (ref 0.0–1.0)
pH: 6.5 (ref 5.0–8.0)

## 2024-01-10 LAB — LIPID PANEL
Cholesterol: 200 mg/dL (ref 0–200)
HDL: 46.4 mg/dL (ref >39.00)
LDL Cholesterol: 137 mg/dL — ABNORMAL HIGH (ref 0–99)
NonHDL: 153.18
Total CHOL/HDL Ratio: 4
Triglycerides: 79 mg/dL (ref 0.0–149.0)
VLDL: 15.8 mg/dL (ref 0.0–40.0)

## 2024-01-10 LAB — CBC WITH DIFFERENTIAL/PLATELET
Basophils Absolute: 0 K/uL (ref 0.0–0.1)
Basophils Relative: 0.8 % (ref 0.0–3.0)
Eosinophils Absolute: 0.1 K/uL (ref 0.0–0.7)
Eosinophils Relative: 2.2 % (ref 0.0–5.0)
HCT: 42 % (ref 39.0–52.0)
Hemoglobin: 14.2 g/dL (ref 13.0–17.0)
Lymphocytes Relative: 26.8 % (ref 12.0–46.0)
Lymphs Abs: 1.5 K/uL (ref 0.7–4.0)
MCHC: 33.9 g/dL (ref 30.0–36.0)
MCV: 97.7 fl (ref 78.0–100.0)
Monocytes Absolute: 0.4 K/uL (ref 0.1–1.0)
Monocytes Relative: 6.8 % (ref 3.0–12.0)
Neutro Abs: 3.5 K/uL (ref 1.4–7.7)
Neutrophils Relative %: 63.4 % (ref 43.0–77.0)
Platelets: 140 K/uL — ABNORMAL LOW (ref 150.0–400.0)
RBC: 4.3 Mil/uL (ref 4.22–5.81)
RDW: 12.8 % (ref 11.5–15.5)
WBC: 5.5 K/uL (ref 4.0–10.5)

## 2024-01-10 LAB — HEPATIC FUNCTION PANEL
ALT: 24 U/L (ref 0–53)
AST: 35 U/L (ref 0–37)
Albumin: 4.5 g/dL (ref 3.5–5.2)
Alkaline Phosphatase: 51 U/L (ref 39–117)
Bilirubin, Direct: 0.2 mg/dL (ref 0.0–0.3)
Total Bilirubin: 1.2 mg/dL (ref 0.2–1.2)
Total Protein: 6.6 g/dL (ref 6.0–8.3)

## 2024-01-10 LAB — BASIC METABOLIC PANEL WITH GFR
BUN: 15 mg/dL (ref 6–23)
CO2: 30 meq/L (ref 19–32)
Calcium: 9.4 mg/dL (ref 8.4–10.5)
Chloride: 103 meq/L (ref 96–112)
Creatinine, Ser: 0.92 mg/dL (ref 0.40–1.50)
GFR: 92.61 mL/min (ref >60.00)
Glucose, Bld: 96 mg/dL (ref 70–99)
Potassium: 4 meq/L (ref 3.5–5.1)
Sodium: 140 meq/L (ref 135–145)

## 2024-01-10 LAB — VITAMIN B12: Vitamin B-12: 451 pg/mL (ref 211–911)

## 2024-01-10 LAB — HEMOGLOBIN A1C: Hgb A1c MFr Bld: 5.1 % (ref 4.6–6.5)

## 2024-01-10 LAB — VITAMIN D 25 HYDROXY (VIT D DEFICIENCY, FRACTURES): VITD: 25.75 ng/mL — ABNORMAL LOW (ref 30.00–100.00)

## 2024-01-10 LAB — PSA: PSA: 0.85 ng/mL (ref 0.10–4.00)

## 2024-01-10 LAB — TSH: TSH: 1.06 u[IU]/mL (ref 0.35–5.50)

## 2024-01-10 MED ORDER — TRIAMCINOLONE ACETONIDE 0.1 % EX CREA: 1.0000 | TOPICAL_CREAM | Freq: Two times a day (BID) | CUTANEOUS | 1 refills | Status: AC

## 2024-01-10 NOTE — Patient Instructions (Addendum)
 Please take all new medication as prescribed  - the steroid cream for the left back dermatitis  Please continue all other medications as before, and refills have been done if requested.  Please have the pharmacy call with any other refills you may need.  Please continue your efforts at being more active, low cholesterol diet, and weight control.  You are otherwise up to date with prevention measures today.  Please keep your appointments with your specialists as you may have planned  You will be contacted regarding the referral for: PT and Orthopedic  Please make an Appointment to return in 6 months, or sooner if needed

## 2024-01-10 NOTE — Progress Notes (Unsigned)
 Patient ID: Todd Sims, male   DOB: 11-29-1965, 58 y.o.   MRN: [id number]         Chief Complaint:: wellness exam and Annual Exam (Been having head pressure since December , has some ache on his back for 3 months that's itchy , wants a full blood panel , got a question about BP med , wants a referral to PT )  , low b12 and vit d, depression, head pressure, rash, right neck and shoulder pain       HPI:  Todd Sims is a 58 y.o. male here for wellness exam; declines tdap and pneumovax, o/w up to date                        Also recently started CPAP for OSA, has f/u pulm soon.  Did have low b12 , taking OTC b12 oral for over the past month.  Pt denies chest pain, increased sob or doe, wheezing, orthopnea, PND, increased LE swelling, palpitations, dizziness or syncope.   Pt denies polydipsia, polyuria, or new focal neuro s/s.    Pt denies fever, wt loss, night sweats, loss of appetite, or other constitutional symptoms  But does have persistent complaints of non discrete frontal head pressure, glare sensitivity, eye pain and ear pain not well defined.  Has had mild to mod worsening depressive symptoms, but no suicidal ideation, or panic; has ongoing anxiety, declines any tx today.  Also has a faint erythem patchy rash to left lower back he can't stop scratching.  Also has mild intermittent but persistent x 2 weeks of right postlateral neck pain with some also shoulder soreness on movement.  Peak wt has been 180 approx 2 yrs ago.  Lost wt with better diet. Wt Readings from Last 3 Encounters:  01/10/24 159 lb (72.1 kg)  11/11/23 159 lb (72.1 kg)  10/21/23 159 lb 9.6 oz (72.4 kg)   BP Readings from Last 3 Encounters:  01/10/24 120/82  11/11/23 (!) 142/87  10/21/23 108/60   Immunization History  Administered Date(s) Administered   Tdap 08/07/2011   Zoster Recombinant(Shingrix) 12/18/2020, 02/13/2021   Health Maintenance Due  Topic Date Due   HIV Screening  Never done   Pneumococcal  Vaccine: 50+ Years (1 of 1 - PCV) Never done   DTaP/Tdap/Td (2 - Td or Tdap) 08/06/2021      Past Medical History:  Diagnosis Date   ALLERGIC RHINITIS 08/26/2007   Allergy    Anxiety 08/06/2010   Arthritis    Cervical neck pain with evidence of disc disease 08/06/2010   Chronic prostatitis 08/07/2011   Degenerative disk disease    lumbar and C- spine   Depression    GERD 04/18/2010   HYPERLIPIDEMIA 08/26/2007   Premature ejaculation 08/06/2010   Thoracic outlet syndrome 08/07/2011   Past Surgical History:  Procedure Laterality Date   COLONOSCOPY     THORACIC OUTLET SURGERY Right 2014    reports that he has never smoked. He has never used smokeless tobacco. He reports current alcohol use of about 2.0 standard drinks of alcohol per week. He reports that he does not use drugs. family history includes Colon polyps in his father; Lung cancer in his mother. Allergies  Allergen Reactions   Tree Extract Cough    rhinitis   Current Outpatient Medications on File Prior to Visit  Medication Sig Dispense Refill   cyanocobalamin  (VITAMIN B12) 1000 MCG tablet Take 1,000 mcg by mouth daily.  Multiple Vitamin (MULTIVITAMIN) tablet Take 1 tablet by mouth daily.     olmesartan  (BENICAR ) 40 MG tablet Take 1 tablet (40 mg total) by mouth daily. 90 tablet 3   TURMERIC-GINGER PO Take by mouth.     fluticasone  (FLONASE ) 50 MCG/ACT nasal spray Place 2 sprays into both nostrils 2 (two) times daily. (Patient not taking: Reported on 01/10/2024) 16 g 6   No current facility-administered medications on file prior to visit.        ROS:  All others reviewed and negative.  Objective        PE:  BP 120/82 (BP Location: Right Arm, Patient Position: Sitting, Cuff Size: Normal)   Pulse (!) 56   Temp 98.6 F (37 C) (Oral)   Ht 5' 11 (1.803 m)   Wt 159 lb (72.1 kg)   SpO2 98%   BMI 22.18 kg/m                 Constitutional: Pt appears in NAD               HENT: Head: NCAT.                 Right Ear: External ear normal.                 Left Ear: External ear normal.  TM's clear               Eyes: . Pupils are equal, round, and reactive to light. Conjunctivae and EOM are normal               Nose: without d/c or deformity               Cardiovascular: Normal rate and regular rhythm.                 Pulmonary/Chest: Effort normal and breath sounds without rales or wheezing.                Abd:  Soft, NT, ND, + BS, no organomegaly               Neurological: Pt is alert. At baseline orientation, motor grossly intact               Skin: Skin is warm. Faint erythem nontender rash to left lower back, no other new lesions, LE edema - none; right post lateral neck soreness with some tension, shoulder has reduced ROM on abduction               Psychiatric: Pt behavior is normal without agitation but depressed affect  Micro: none  Cardiac tracings I have personally interpreted today:  none  Pertinent Radiological findings (summarize): none   Lab Results  Component Value Date   WBC 5.5 01/10/2024   HGB 14.2 01/10/2024   HCT 42.0 01/10/2024   PLT 140.0 (L) 01/10/2024   GLUCOSE 96 01/10/2024   CHOL 200 01/10/2024   TRIG 79.0 01/10/2024   HDL 46.40 01/10/2024   LDLDIRECT 122.0 12/03/2021   LDLCALC 137 (H) 01/10/2024   ALT 24 01/10/2024   AST 35 01/10/2024   NA 140 01/10/2024   K 4.0 01/10/2024   CL 103 01/10/2024   CREATININE 0.92 01/10/2024   BUN 15 01/10/2024   CO2 30 01/10/2024   TSH 1.06 01/10/2024   PSA 0.85 01/10/2024   INR 1.0 10/27/2019   HGBA1C 5.1 01/10/2024   Assessment/Plan:  Todd Sims is a 57 y.o. White or  Caucasian [1] male with  has a past medical history of ALLERGIC RHINITIS (08/26/2007), Allergy, Anxiety (08/06/2010), Arthritis, Cervical neck pain with evidence of disc disease (08/06/2010), Chronic prostatitis (08/07/2011), Degenerative disk disease, Depression, GERD (04/18/2010), HYPERLIPIDEMIA (08/26/2007), Premature ejaculation (08/06/2010), and  Thoracic outlet syndrome (08/07/2011).  Mucous retention cyst of maxillary sinus Has seen ENT  - ne tx needed  Encounter for well adult exam with abnormal findings Age and sex appropriate education and counseling updated with regular exercise and diet Referrals for preventative services - none needed Immunizations addressed - declines tdap and pneumovax Smoking counseling  - none needed Evidence for depression or other mood disorder - none significant Most recent labs reviewed. I have personally reviewed and have noted: 1) the patient's medical and social history 2) The patient's current medications and supplements 3) The patient's height, weight, and BMI have been recorded in the chart   Vitamin D  deficiency Last vitamin D  Lab Results  Component Value Date   VD25OH 25.75 (L) 01/10/2024   Low, to start oral replacement   HYPERLIPIDEMIA Lab Results  Component Value Date   LDLCALC 137 (H) 01/10/2024   Uncontrolled, declines statin for now, pt to continue lower chol diet   HTN (hypertension) BP Readings from Last 3 Encounters:  01/10/24 120/82  11/11/23 (!) 142/87  10/21/23 108/60   Stable, pt to continue medical treatment benicar  40 mg qd   Depression Recurrent mild, no SI or HI, declines tx or counseling  B12 deficiency Lab Results  Component Value Date   VITAMINB12 451 01/10/2024   Stable, cont oral replacement - b12 1000 mcg qd   Cervical neck pain with evidence of disc disease With recent flare it seems, also with right shoulder pain - for ortho referral, and PT referral as well  Rash C/w dermatitis - for triam cr prn asd  Headache More of a pressure sensation with lightheaded, glare intolerance, eyes and ear pain - exam benign today, could be atypical migraine, neuro exam benign, ok to hold imaging for now, consider neurology referrral  Followup: Return in about 6 months (around 07/09/2024).  Lynwood Rush, MD 01/11/2024 7:36 PM [facility]

## 2024-01-10 NOTE — Assessment & Plan Note (Signed)
 Has seen ENT  - ne tx needed

## 2024-01-11 ENCOUNTER — Encounter: Payer: Self-pay | Admitting: Internal Medicine

## 2024-01-11 DIAGNOSIS — R21 Rash and other nonspecific skin eruption: Secondary | ICD-10-CM | POA: Insufficient documentation

## 2024-01-11 DIAGNOSIS — R519 Headache, unspecified: Secondary | ICD-10-CM | POA: Insufficient documentation

## 2024-01-11 NOTE — Assessment & Plan Note (Signed)
 Lab Results  Component Value Date   LDLCALC 137 (H) 01/10/2024   Uncontrolled, declines statin for now, pt to continue lower chol diet

## 2024-01-11 NOTE — Assessment & Plan Note (Signed)
 Lab Results  Component Value Date   VITAMINB12 451 01/10/2024   Stable, cont oral replacement - b12 1000 mcg qd

## 2024-01-11 NOTE — Assessment & Plan Note (Signed)
 C/w dermatitis - for triam cr prn asd

## 2024-01-11 NOTE — Assessment & Plan Note (Signed)
Age and sex appropriate education and counseling updated with regular exercise and diet Referrals for preventative services - none needed Immunizations addressed - declines tdap and pneumovax Smoking counseling  - none needed Evidence for depression or other mood disorder - none significant Most recent labs reviewed. I have personally reviewed and have noted: 1) the patient's medical and social history 2) The patient's current medications and supplements 3) The patient's height, weight, and BMI have been recorded in the chart

## 2024-01-11 NOTE — Assessment & Plan Note (Addendum)
 With recent flare it seems, also with right shoulder pain - for ortho referral, and PT referral as well

## 2024-01-11 NOTE — Assessment & Plan Note (Signed)
 Recurrent mild, no SI or HI, declines tx or counseling

## 2024-01-11 NOTE — Assessment & Plan Note (Signed)
 Last vitamin D  Lab Results  Component Value Date   VD25OH 25.75 (L) 01/10/2024   Low, to start oral replacement

## 2024-01-11 NOTE — Assessment & Plan Note (Signed)
 More of a pressure sensation with lightheaded, glare intolerance, eyes and ear pain - exam benign today, could be atypical migraine, neuro exam benign, ok to hold imaging for now, consider neurology referrral

## 2024-01-11 NOTE — Assessment & Plan Note (Signed)
 BP Readings from Last 3 Encounters:  01/10/24 120/82  11/11/23 (!) 142/87  10/21/23 108/60   Stable, pt to continue medical treatment benicar  40 mg qd

## 2024-01-31 ENCOUNTER — Ambulatory Visit (INDEPENDENT_AMBULATORY_CARE_PROVIDER_SITE_OTHER): Admitting: Sleep Medicine

## 2024-01-31 ENCOUNTER — Encounter: Payer: Self-pay | Admitting: Sleep Medicine

## 2024-01-31 VITALS — BP 120/80 | HR 62 | Temp 98.9°F | Ht 71.0 in | Wt 159.2 lb

## 2024-01-31 DIAGNOSIS — I10 Essential (primary) hypertension: Secondary | ICD-10-CM | POA: Diagnosis not present

## 2024-01-31 DIAGNOSIS — G4733 Obstructive sleep apnea (adult) (pediatric): Secondary | ICD-10-CM

## 2024-01-31 NOTE — Patient Instructions (Signed)

## 2024-01-31 NOTE — Progress Notes (Signed)
 Name:Todd Sims MRN: [id number] DOB: 1965/08/17   CHIEF COMPLAINT:  CPAP F/U   HISTORY OF PRESENT ILLNESS:  Todd Sims is a 58 y.o. w/ a h/o OSA and HTN who presents for CPAP F/U visit. Reports using CPAP therapy every night, which is confirmed by compliance data. He is currently using the Airfit N30i nasal mask, which is comfortable. States that he does not feel any improvement since starting CPAP therapy. Reports chronic headaches and tinnitus and is being evaluated by neurology.    EPWORTH SLEEP SCORE 5    10/21/2023    1:00 PM  Results of the Epworth flowsheet  Sitting and reading 1  Watching TV 1  Sitting, inactive in a public place (e.g. a theatre or a meeting) 0  As a passenger in a car for an hour without a break 0  Lying down to rest in the afternoon when circumstances permit 2  Sitting and talking to someone 0  Sitting quietly after a lunch without alcohol 1  In a car, while stopped for a few minutes in traffic 0  Total score 5    PAST MEDICAL HISTORY :   has a past medical history of ALLERGIC RHINITIS (08/26/2007), Allergy, Anxiety (08/06/2010), Arthritis, Cervical neck pain with evidence of disc disease (08/06/2010), Chronic prostatitis (08/07/2011), Degenerative disk disease, Depression, GERD (04/18/2010), HYPERLIPIDEMIA (08/26/2007), Premature ejaculation (08/06/2010), and Thoracic outlet syndrome (08/07/2011).  has a past surgical history that includes Thoracic outlet surgery (Right, 2014) and Colonoscopy. Prior to Admission medications   Medication Sig Start Date End Date Taking? Authorizing Provider  fluticasone  (FLONASE ) 50 MCG/ACT nasal spray Place 2 sprays into both nostrils 2 (two) times daily. Patient taking differently: Place 2 sprays into both nostrils 2 (two) times daily as needed for allergies or rhinitis. 09/15/23  Yes Soldatova, Liuba, MD  levocetirizine (XYZAL  ALLERGY 24HR) 5 MG tablet Take 1 tablet (5 mg total) by mouth every  evening. Patient taking differently: Take 5 mg by mouth as needed for allergies. 09/15/23  Yes Soldatova, Liuba, MD  olmesartan  (BENICAR ) 40 MG tablet Take 1 tablet (40 mg total) by mouth daily. Patient taking differently: Take 1 tablet (40 mg total) by mouth daily. 06/10/23  Yes Norleen Lynwood ORN, MD  predniSONE  (DELTASONE ) 10 MG tablet 3 tabs by mouth per day for 3 days,2tabs per day for 3 days,1tab per day for 3 days Patient not taking: Reported on 10/21/2023 05/24/23   Norleen Lynwood ORN, MD   Allergies  Allergen Reactions   Tree Extract Cough    rhinitis    FAMILY HISTORY:  family history includes Colon polyps in his father; Lung cancer in his mother. SOCIAL HISTORY:  reports that he has never smoked. He has never used smokeless tobacco. He reports current alcohol use of about 2.0 standard drinks of alcohol per week. He reports that he does not use drugs.   Review of Systems:  Gen:  Denies  fever, sweats, chills weight loss  HEENT: Denies blurred vision, double vision, ear pain, eye pain, hearing loss, nose bleeds, sore throat Cardiac:  No dizziness, chest pain or heaviness, chest tightness,edema, No JVD Resp:   No cough, -sputum production, -shortness of breath,-wheezing, -hemoptysis,  Gi: Denies swallowing difficulty, stomach pain, nausea or vomiting, diarrhea, constipation, bowel incontinence Gu:  Denies bladder incontinence, burning urine Ext:   Denies Joint pain, stiffness or swelling Skin: Denies  skin rash, easy bruising or bleeding or hives Endoc:  Denies polyuria, polydipsia , polyphagia  or weight change Psych:   Denies depression, insomnia or hallucinations  Other:  All other systems negative  VITAL SIGNS: BP 120/80   Pulse 62   Temp 98.9 F (37.2 C)   Ht 5' 11 (1.803 m)   Wt 159 lb 3.2 oz (72.2 kg)   SpO2 98%   BMI 22.20 kg/m    Physical Examination:   General Appearance: No distress  EYES PERRLA, EOM intact.   NECK Supple, No JVD Pulmonary: normal breath sounds,  No wheezing.  CardiovascularNormal S1,S2.  No m/r/g.   Abdomen: Benign, Soft, non-tender. Skin:   warm, no rashes, no ecchymosis  Extremities: normal, no cyanosis, clubbing. Neuro:without focal findings,  speech normal  PSYCHIATRIC: Mood, affect within normal limits.   ASSESSMENT AND PLAN  OSA Patient is using and benefiting from CPAP therapy. Discussed the consequences of untreated sleep apnea. Advised not to drive drowsy for safety of patient and others. Will follow up in 3 months.    HTN Stable, on current management. Following with PCP.    Patient  satisfied with Plan of action and management. All questions answered  I spent a total of 32 minutes reviewing chart data, face-to-face evaluation with the patient, counseling and coordination of care as detailed above.    Giannina Bartolome, M.D.  Sleep Medicine [facility]

## 2024-02-02 NOTE — Progress Notes (Signed)
 NEUROLOGY FOLLOW UP OFFICE NOTE  ARGIL MAHL [id number]  Subjective:  Todd Sims is a 58 y.o. year old right-handed male with a medical history of transient global amnesia (10/2019), degenerative disk disease (cervical and lumbar), HLD, GERD, anxiety who we last saw on 11/11/23 for head pressure, fatigue, cognitive change.  To briefly review: 11/11/23: Patient has had symptoms for over a year. He has felt fatigued, less cognitively sharp, and some loss of sexual energy. Since 03/2023 he has had had head pressure. He feels like blood is not draining well. He feels it is also behind his eyes. It causes light sensitivity and mental fog. He feels there is a lot of glare in his vision. He has a hard time focusing on people speaking, particularly in meetings. He does not remember feeling normal since 03/2023. The head pressure is worse in the morning. There are good days with maybe only 1/10 pain, but then other days it can be 6-7/10. He does not think it is ever completely gone.   He will take aspirin  that will help with the pressure but not the other symptoms. He has taken it only once in the last 2-3 weeks.   He saw his PCP who sent him to eye doctor (exam looked good), ENT who looked at sinuses (looked good), and sleep medicine (10/21/23) who suspected OSA and ordered sleep study. He just did the home sleep study last week and sent back on 11/08/23 and waiting on the results.   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 He has hearing aids for this. He also had some blood pressure problems and was put on medication that helped his tinnitus (omesartan).    He mentions a history of base of skull fracture in 1987. He had CSF leak that he laid in the hospital for a week until it stopped. He has had no problems with this since. He had TOS in 2012 on the right shoulder. In 2021, he had an episode of memory loss and asking same questions repeatedly. He went to hospital  and diagnosed with TGA (normal MRI).   Patient takes a multivitamin and tumeric-ginger. He does not take B12 or vit D.   Smoker: never Caffiene use: large coffee in morning, tea or soda midday most days EtOH use: 3 drinks per week Restrictive diet: no Family history of neurologic disease, including headaches: mother with bad depression needing ECT  Most recent Assessment and Plan (11/11/23): Todd Sims is a 58 y.o. male who presents for evaluation of head pressure, fatigue, and cognitive change. He has a relevant medical history of transient global amnesia (10/2019), degenerative disk disease (cervical and lumbar), HLD, GERD, anxiety. His neurological examination is essentially normal today. Available diagnostic data is significant for normal MRI brain and CTA head and neck in 2021. His vit B12 and vit D were low in 12/2022. He was also recently seen by sleep medicine who suspects OSA. His sleep study results are pending. Patient's symptoms could be a combination of untreated OSA, B12 deficiency, and vit D deficiency. Less likely is migraine or intracranial pathology. Given his history of skull fracture with CSF leak and transient global amnesia, it is reasonable to repeat MRI brain though.   PLAN: -Blood work: B1, B12, MMA, vit D, Mg -MRI brain w/wo contrast -Follow up with sleep medicine for probable OSA  Since their last visit: Labs showed an elevated MMA and borderline low B12. I recommended  B12 1000 mcg daily. He is taking this. MRI brain was unremarkable.  Patient is following with sleep medicine and is now using CPAP every night.   Patient is not noticing any difference in symptoms.  -He continues to have tinnitus, worse at night and with worsening head pressure. He thinks going up on BP medications may have helped with tinnitus but returned when he cut the dose back. -He feels a head pressure most mornings. It is worse if he is stressed or in bright lights. It is dull achy around  the forehead and eyes. It makes it hard to work. Very occasionally in the afternoon when he will not have head pressure, but most of the time he does. Blue glare glasses do help. -He will have more clarity at times and feels better about life but then it will be worse when stressed. He will still have symptoms even when not stressed or anxious. -Still a lot of fatigue.  He went to PT for neck pain which helped with neck and shoulder pain but not head pressure.  Of note, patient has taken medication in the past for depression, but does not currently.  MEDICATIONS:  Outpatient Encounter Medications as of 02/10/2024  Medication Sig   cyanocobalamin  (VITAMIN B12) 1000 MCG tablet Take 1,000 mcg by mouth daily.   Multiple Vitamin (MULTIVITAMIN) tablet Take 1 tablet by mouth daily.   nortriptyline (PAMELOR) 10 MG capsule Take 2 capsules (20 mg total) by mouth at bedtime. Take 1 capsule (10 mg) at bedtime for 1 week, then increase to 2 capsules (20 mg) at bedtime thereafter   olmesartan  (BENICAR ) 40 MG tablet Take 1 tablet (40 mg total) by mouth daily.   TURMERIC-GINGER PO Take by mouth.   fluticasone  (FLONASE ) 50 MCG/ACT nasal spray Place 2 sprays into both nostrils 2 (two) times daily. (Patient not taking: Reported on 02/10/2024)   triamcinolone  cream (KENALOG ) 0.1 % Apply 1 Application topically 2 (two) times daily. (Patient not taking: Reported on 02/10/2024)   No facility-administered encounter medications on file as of 02/10/2024.    PAST MEDICAL HISTORY: Past Medical History:  Diagnosis Date   ALLERGIC RHINITIS 08/26/2007   Allergy    Anxiety 08/06/2010   Arthritis    Cervical neck pain with evidence of disc disease 08/06/2010   Chronic prostatitis 08/07/2011   Degenerative disk disease    lumbar and C- spine   Depression    GERD 04/18/2010   HYPERLIPIDEMIA 08/26/2007   Premature ejaculation 08/06/2010   Thoracic outlet syndrome 08/07/2011    PAST SURGICAL HISTORY: Past  Surgical History:  Procedure Laterality Date   COLONOSCOPY     THORACIC OUTLET SURGERY Right 2014    ALLERGIES: Allergies  Allergen Reactions   Tree Extract Cough    rhinitis    FAMILY HISTORY: Family History  Problem Relation Age of Onset   Lung cancer Mother    Colon polyps Father    Colon cancer Neg Hx    Esophageal cancer Neg Hx    Liver cancer Neg Hx    Pancreatic cancer Neg Hx    Rectal cancer Neg Hx    Stomach cancer Neg Hx     SOCIAL HISTORY: Social History   Tobacco Use   Smoking status: Never   Smokeless tobacco: Never  Vaping Use   Vaping status: Never Used  Substance Use Topics   Alcohol use: Yes    Alcohol/week: 2.0 standard drinks of alcohol    Types: 2 Cans of beer per  week    Comment: occ   Drug use: No   Social History   Social History Narrative   Married with 3 children   Are you right handed or left handed? Right   Are you currently employed ?    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   Who lives with you? family   What type of home do you live in: 1 story or 2 story? two    Caffeine 2 cup in am 1 cup in PM      Objective:  Vital Signs:  BP 134/83   Pulse 72   Ht 5' 11 (1.803 m)   Wt 157 lb (71.2 kg)   SpO2 99%   BMI 21.90 kg/m   General: No acute distress.  Patient appears well-groomed.   Head:  Normocephalic/atraumatic Eyes:  fundi examined, disc margins clear, no obvious papilledema Neck: supple Heart: regular rate and rhythm Lungs: Clear to auscultation bilaterally. Vascular: No carotid bruits.  Neurological Exam: Mental status: alert and oriented, speech fluent and not dysarthric, language intact.  Cranial nerves: CN I: not tested CN II: pupils equal, round and reactive to light, visual fields intact CN III, IV, VI:  full range of motion, no nystagmus, no ptosis CN V: facial sensation intact. CN VII: upper and lower face symmetric CN VIII: hearing intact CN IX, X: uvula  midline CN XI: sternocleidomastoid and trapezius muscles intact CN XII: tongue midline  Bulk & Tone: normal, no fasciculations. Motor:  muscle strength 5/5 throughout Deep Tendon Reflexes:  2+ throughout.   Sensation:  Light touch sensation intact. Finger to nose testing:  Without dysmetria.    Gait:  Normal station and stride.  Romberg negative.   Labs and Imaging review: New results: 01/10/24: HbA1c: 5.1 Lipid panel: tChol 200, LDL 137, TG 79 TSH wnl B12: 451 Vit D: 25.75  11/11/23: Mg 2.0 Vit D: 38 MMA elevated to 365 B12: 287 B1 wnl  MRI brain w/wo contrast (11/24/23): IMPRESSION: 1. No acute intracranial abnormality. 2. Incidental absent septum pellucidum.  Previously reviewed results: 01/04/23: B12: 216 Vit D: 20.39   06/12/22: ESR 14 CRP < 1.0   Imaging/Procedures: CTA head and neck (10/27/19): IMPRESSION: 1. Minimal intracranial atherosclerosis without large vessel occlusion or significant proximal stenosis. 2. Widely patent cervical carotid and vertebral arteries.   MRI brain wo contrast (10/27/19): IMPRESSION: 1. No acute intracranial abnormality. 2. Absent septum pellucidum, otherwise unremarkable appearance of the brain.   EEG (10/27/19): Description: The posterior dominant rhythm consists of 10 Hz activity of moderate voltage (25-35 uV) seen predominantly in posterior head regions, symmetric and reactive to eye opening and eye closing. Drowsiness was characterized by attenuation of the posterior background rhythm. Sleep was characterized by vertex waves, sleep spindles (12 to 14 Hz), maximal frontocentral region.  Hyperventilation did not show any EEG change.  Physiology photic driving was seen during photic stimulation.     IMPRESSION: This study is within normal limits. No seizures or epileptiform discharges were seen throughout the recording.   MRI cervical spine wo contrast (01/24/22): Findings: Slightly heterogeneous appearance bone marrow of the   clivus and minimal decreased signal intensity bone marrow of the  cervical spine may be related to the patient's habitus. Result of  anemia not entirely excluded.  No history of cancer to suggest  infiltrative process.   Visualized intracranial structures and cervical medullary junction  unremarkable.  No focal cervical cord signal abnormality.  Both vertebral arteries are patent.  Visualized paravertebral  structures unremarkable.   C2-3:  Negative.   C3-4:  Right-sided uncinate bony overgrowth with mild to slightly  moderate right foraminal narrowing.  Minimal impression upon the  right lateral ventral aspect of the thecal sac and possibly the  right ventral nerve root.   C4-5:  Minimal bulge.  Minimal left-sided uncinate bony overgrowth.   C5-6:  Mild bulge greater to the left.  Uncinate bony overgrowth  greater left.  Very mild left-sided and minimal right-sided  foraminal narrowing.  Minimal left-sided spinal stenosis.   C6-7:  Mild left uncinate hypertrophy with very mild left-sided  foraminal narrowing.   C7-T1:  Minimal facet joint degenerative changes.   IMPRESSION:  In this patient with right-sided symptoms, most notable right-sided  findings are at the C3-4 level as detailed above.    CT sinus wo contrast (08/16/23): IMPRESSION: Bilateral maxillary sinus retention cysts. Patent sinus drainage pathways. The other paranasal sinuses are clear.  Assessment/Plan:  This is Todd Sims, a 58 y.o. male with: Head pressure - worse in the morning. Not worse when laying down during the day for naps. Unclear etiology but still wonder if OSA is playing a role. He has been on CPAP now for 2 months but not noticed a difference. There may be some depression at play with symptoms as well, so nortriptyline is likely the best option for treatment.  Fatigue, cognitive change - could be combination of low B12, low vit D, OSA, and untreated depression Vit D deficiency - on  supplementation B12 deficiency - on supplementation  Plan: -Continue B12 1000 mcg daily -Continue Vit D supplementation -Continue CPAP nightly for OSA -Start nortriptyline 10 mg at bedtime for 1 week, then 20 mg at bedtime thereafter  Return to clinic in 3 months  Total time spent reviewing records, interview, history/exam, documentation, and coordination of care on day of encounter:  40 min  Venetia Potters, MD

## 2024-02-10 ENCOUNTER — Encounter: Payer: Self-pay | Admitting: Neurology

## 2024-02-10 ENCOUNTER — Ambulatory Visit (INDEPENDENT_AMBULATORY_CARE_PROVIDER_SITE_OTHER): Admitting: Neurology

## 2024-02-10 VITALS — BP 134/83 | HR 72 | Ht 71.0 in | Wt 157.0 lb

## 2024-02-10 DIAGNOSIS — E538 Deficiency of other specified B group vitamins: Secondary | ICD-10-CM | POA: Diagnosis not present

## 2024-02-10 DIAGNOSIS — E559 Vitamin D deficiency, unspecified: Secondary | ICD-10-CM

## 2024-02-10 DIAGNOSIS — G4733 Obstructive sleep apnea (adult) (pediatric): Secondary | ICD-10-CM

## 2024-02-10 DIAGNOSIS — Z8781 Personal history of (healed) traumatic fracture: Secondary | ICD-10-CM | POA: Diagnosis not present

## 2024-02-10 DIAGNOSIS — R519 Headache, unspecified: Secondary | ICD-10-CM | POA: Diagnosis not present

## 2024-02-10 DIAGNOSIS — R4189 Other symptoms and signs involving cognitive functions and awareness: Secondary | ICD-10-CM

## 2024-02-10 MED ORDER — NORTRIPTYLINE HCL 10 MG PO CAPS: 20.0000 mg | ORAL_CAPSULE | Freq: Every day | ORAL | 5 refills | Status: DC

## 2024-02-10 NOTE — Patient Instructions (Addendum)
 -Continue B12 1000 mcg daily -Continue Vit D supplementation -Continue CPAP nightly for OSA -Start nortriptyline 10 mg at bedtime for 1 week, then 20 mg at bedtime thereafter  Please update me in about 1 month on how you are doing.  Return to clinic in 3 months  The physicians and staff at [facility] are committed to providing excellent care. You may receive a survey requesting feedback about your experience at our office. We strive to receive very good responses to the survey questions. If you feel that your experience would prevent you from giving the office a very good  response, please contact our office to try to remedy the situation. We may be reached at [phone]. Thank you for taking the time out of your busy day to complete the survey.  Venetia Potters, MD [facility]   Vitamins and herbs that show potential to help with headaches:  Magnesium: Magnesium (250 mg twice a day or 500 mg at bed) has a relaxant effect on smooth muscles such as blood vessels. Individuals suffering from frequent or daily headache usually have low magnesium levels which can be increase with daily supplementation of 400-750 mg. Three trials found 40-90% average headache reduction when used as a preventative. Magnesium also demonstrated the benefit in menstrually related migraine. Magnesium is part of the messenger system in the serotonin cascade and it is a good muscle relaxant. It is also useful for constipation which can be a side effect of other medications used to treat migraine. Good sources include nuts, whole grains, and tomatoes.  Riboflavin (vitamin B 2) 200 mg twice a day. This vitamin assists nerve cells in the production of ATP a principal energy storing molecule. It is necessary for many chemical reactions in the body. There have been at least 3 clinical trials of riboflavin using 400 mg per day all of which suggested that migraine frequency can be decreased. All 3 trials showed  significant improvement in over half of migraine sufferers. The supplement is found in bread, cereal, milk, meat, and poultry. Most Americans get more riboflavin than the recommended daily allowance, however riboflavin deficiency is not necessary for the supplements to help prevent headache.  Feverfew: Feverfew is a common garden herb native to [place] and popular in [place] as a treatment for disorders typically controlled by aspirin . The mechanism of action is unknown but is believed to be related to a chemical called parthenolide which helps the body use serotonin more effectively. Serotonin helps prevent migraine and assists with resolution when it occurs. Parthenolide also inhibits the release of histamine which is linked to pain and inflammation.  Consistency of active ingredients in different products can be a problem. Some formulations don't have the active ingredient (parthenolide) that prevents migraine. A parthenolide content of 0.2% is generally recommended. Typical dosage is one capsule 3 times a day.  Coenzyme Q10: This is present in almost all cells in the body and is critical component for the conversion of energy. Recent studies have shown that a nutritional supplement of CoQ10 can reduce the frequency of migraine attacks by improving the energy production of cells as with riboflavin. Doses of 150 mg twice a day have been shown to be effective.  Melatonin: Increasing evidence shows correlation between melatonin secretion and headache conditions. Melatonin supplementation has decreased headache intensity and duration. It is widely used as a sleep aid. Sleep is natures way of dealing with migraine. A dose of 3 mg is recommended to start for headaches including cluster headache.  Higher doses up to 15 mg has been reviewed for use in Cluster headache and have been used. The rationale behind using melatonin for cluster is that many theories regarding the cause of Cluster headache center  around the disruption of the normal circadian rhythm in the brain. This helps restore the normal circadian rhythm.  Ginger: Ginger has a small amount of antihistamine and anti-inflammatory action which may help headache. It is primarily used for nausea and may aid in the absorption of other medications.

## 2024-02-21 ENCOUNTER — Telehealth: Payer: Self-pay

## 2024-02-21 NOTE — Telephone Encounter (Signed)
 Copied from CRM [id number]. Topic: Appointments - Scheduling Inquiry for Clinic >> Feb 21, 2024  2:13 PM Victoria A wrote: Reason for CRM: Patient said that he has seen Dr.Plotnikov in the past and would like to ask if he would see take him as his Primary now that Dr.John is returning. Agent informed patient that Dr.Plotnikov was not accepting new patients at the current time. Patient would like call back please [phone] (M)

## 2024-03-02 ENCOUNTER — Other Ambulatory Visit: Payer: Self-pay

## 2024-03-02 DIAGNOSIS — R519 Headache, unspecified: Secondary | ICD-10-CM

## 2024-03-02 MED ORDER — NORTRIPTYLINE HCL 10 MG PO CAPS: ORAL_CAPSULE | ORAL | 4 refills | Status: DC

## 2024-03-03 DIAGNOSIS — G4733 Obstructive sleep apnea (adult) (pediatric): Secondary | ICD-10-CM | POA: Diagnosis not present

## 2024-03-08 DIAGNOSIS — G4733 Obstructive sleep apnea (adult) (pediatric): Secondary | ICD-10-CM | POA: Diagnosis not present

## 2024-03-21 ENCOUNTER — Telehealth: Payer: Self-pay

## 2024-03-21 NOTE — Telephone Encounter (Signed)
 Copied from CRM #[id number]. Topic: Appointments - Scheduling Inquiry for Clinic >> Mar 20, 2024  4:20 PM Alfonso ORN wrote: Reason for CRM: pt called requesting to be established with Plotnikov as new pcp after pcp retiring. He stated that he has been seen for appointments with provider in the past advised pcp not taking new patients unless family of existing pt . Please contact pt to confirm

## 2024-03-21 NOTE — Telephone Encounter (Signed)
 Ok with me

## 2024-04-02 ENCOUNTER — Encounter: Payer: Self-pay | Admitting: Neurology

## 2024-04-02 DIAGNOSIS — G4733 Obstructive sleep apnea (adult) (pediatric): Secondary | ICD-10-CM | POA: Diagnosis not present

## 2024-04-14 ENCOUNTER — Other Ambulatory Visit: Payer: Self-pay

## 2024-04-14 ENCOUNTER — Other Ambulatory Visit: Payer: Self-pay | Admitting: Neurology

## 2024-04-14 DIAGNOSIS — Z8781 Personal history of (healed) traumatic fracture: Secondary | ICD-10-CM

## 2024-04-14 DIAGNOSIS — R519 Headache, unspecified: Secondary | ICD-10-CM

## 2024-04-14 DIAGNOSIS — R4189 Other symptoms and signs involving cognitive functions and awareness: Secondary | ICD-10-CM

## 2024-04-14 MED ORDER — NORTRIPTYLINE HCL 10 MG PO CAPS: 30.0000 mg | ORAL_CAPSULE | Freq: Every day | ORAL | 5 refills | Status: DC

## 2024-04-26 NOTE — Progress Notes (Signed)
 "  NEUROLOGY FOLLOW UP OFFICE NOTE  Todd Sims [id number]  Subjective:  Todd Sims is a 59 y.o. year old right-handed male with a medical history of transient global amnesia (10/2019), degenerative disk disease (cervical and lumbar), HLD, GERD, anxiety who we last saw on 02/10/24 for head pressure.  To briefly review: 11/11/23: Patient has had symptoms for over a year. He has felt fatigued, less cognitively sharp, and some loss of sexual energy. Since 03/2023 he has had had head pressure. He feels like blood is not draining well. He feels it is also behind his eyes. It causes light sensitivity and mental fog. He feels there is a lot of glare in his vision. He has a hard time focusing on people speaking, particularly in meetings. He does not remember feeling normal since 03/2023. The head pressure is worse in the morning. There are good days with maybe only 1/10 pain, but then other days it can be 6-7/10. He does not think it is ever completely gone.   He will take aspirin  that will help with the pressure but not the other symptoms. He has taken it only once in the last 2-3 weeks.   He saw his PCP who sent him to eye doctor (exam looked good), ENT who looked at sinuses (looked good), and sleep medicine (10/21/23) who suspected OSA and ordered sleep study. He just did the home sleep study last week and sent back on 11/08/23 and waiting on the results.   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 He has hearing aids for this. He also had some blood pressure problems and was put on medication that helped his tinnitus (omesartan).    He mentions a history of base of skull fracture in 1987. He had CSF leak that he laid in the hospital for a week until it stopped. He has had no problems with this since. He had TOS in 2012 on the right shoulder. In 2021, he had an episode of memory loss and asking same questions repeatedly. He went to hospital and diagnosed with TGA  (normal MRI).   Patient takes a multivitamin and tumeric-ginger. He does not take B12 or vit D.   Smoker: never Caffiene use: large coffee in morning, tea or soda midday most days EtOH use: 3 drinks per week Restrictive diet: no Family history of neurologic disease, including headaches: mother with bad depression needing ECT  02/10/24: Labs showed an elevated MMA and borderline low B12. I recommended B12 1000 mcg daily. He is taking this. MRI brain was unremarkable.   Patient is following with sleep medicine and is now using CPAP every night.    Patient is not noticing any difference in symptoms.  -He continues to have tinnitus, worse at night and with worsening head pressure. He thinks going up on BP medications may have helped with tinnitus but returned when he cut the dose back. -He feels a head pressure most mornings. It is worse if he is stressed or in bright lights. It is dull achy around the forehead and eyes. It makes it hard to work. Very occasionally in the afternoon when he will not have head pressure, but most of the time he does. Blue glare glasses do help. -He will have more clarity at times and feels better about life but then it will be worse when stressed. He will still have symptoms even when not stressed or anxious. -Still a lot of fatigue.  He went to PT for neck pain which helped with neck and shoulder pain but not head pressure.   Of note, patient has taken medication in the past for depression, but does not currently.  Most recent Assessment and Plan (02/10/24): This is Todd Sims, a 59 y.o. male with: Head pressure - worse in the morning. Not worse when laying down during the day for naps. Unclear etiology but still wonder if OSA is playing a role. He has been on CPAP now for 2 months but not noticed a difference. There may be some depression at play with symptoms as well, so nortriptyline  is likely the best option for treatment.  Fatigue, cognitive change  - could be combination of low B12, low vit D, OSA, and untreated depression Vit D deficiency - on supplementation B12 deficiency - on supplementation   Plan: -Continue B12 1000 mcg daily -Continue Vit D supplementation -Continue CPAP nightly for OSA -Start nortriptyline  10 mg at bedtime for 1 week, then 20 mg at bedtime thereafter  Since their last visit: Patient messaged me on 04/02/24 mentioning nortriptyline  had helped with stress and anxiety but not head pressure in the morning when he wakes up. He was concerned about a CSF issue and asked for referral to [facility] for CSF consultation which I sent. He also agreed to try increasing nortriptyline  to 30 mg at bedtime.  Patient thinks the nortriptyline  may have helped with his overall well being, but not the head pressure. The head pressure is worse in the morning. Some days it is better throughout the day, some days it stays the same. It is never gone. The pressure is the whole head and face. He thinks tipping his head forward can make the pressure worse. Tipping the head back is better. He does have a lot of tightness in the shoulder. He has been working with PT once per week. He thinks the tinnitus being worse is associated with head pressure worsening.   He continues to use the CPAP nightly, rarely missing a night. He has not noticed a difference with CPAP changing his head pressure.  He thinks his vision is okay. When he has bad head pressure, he can feel like maybe he is not seeing as well, like the glare is bothering him (sensitivity to light).  He continues to take B12 and vit D.  He is concerned about poor CSF flow perhaps high pressure now.  MEDICATIONS:  Outpatient Encounter Medications as of 05/04/2024  Medication Sig   cyanocobalamin  (VITAMIN B12) 1000 MCG tablet Take 1,000 mcg by mouth daily.   Multiple Vitamin (MULTIVITAMIN) tablet Take 1 tablet by mouth daily.   nortriptyline  (PAMELOR ) 10 MG capsule Take 3 capsules (30 mg  total) by mouth at bedtime.   olmesartan  (BENICAR ) 40 MG tablet Take 1 tablet (40 mg total) by mouth daily.   TURMERIC-GINGER PO Take by mouth.   fluticasone  (FLONASE ) 50 MCG/ACT nasal spray Place 2 sprays into both nostrils 2 (two) times daily. (Patient not taking: Reported on 05/04/2024)   triamcinolone  cream (KENALOG ) 0.1 % Apply 1 Application topically 2 (two) times daily. (Patient not taking: Reported on 05/04/2024)   No facility-administered encounter medications on file as of 05/04/2024.    PAST MEDICAL HISTORY: Past Medical History:  Diagnosis Date   ALLERGIC RHINITIS 08/26/2007   Allergy    Anxiety 08/06/2010   Arthritis    Cervical neck pain with evidence of disc disease 08/06/2010   Chronic prostatitis 08/07/2011   Degenerative disk disease  lumbar and C- spine   Depression    GERD 04/18/2010   HYPERLIPIDEMIA 08/26/2007   Premature ejaculation 08/06/2010   Thoracic outlet syndrome 08/07/2011    PAST SURGICAL HISTORY: Past Surgical History:  Procedure Laterality Date   COLONOSCOPY     THORACIC OUTLET SURGERY Right 2014    ALLERGIES: Allergies[1]  FAMILY HISTORY: Family History  Problem Relation Age of Onset   Lung cancer Mother    Colon polyps Father    Colon cancer Neg Hx    Esophageal cancer Neg Hx    Liver cancer Neg Hx    Pancreatic cancer Neg Hx    Rectal cancer Neg Hx    Stomach cancer Neg Hx     SOCIAL HISTORY: Social History[2] Social History   Social History Narrative   Married with 3 children   Are you right handed or left handed? Right   Are you currently employed ?    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   Who lives with you? family   What type of home do you live in: 1 story or 2 story? two    Caffeine 2 cup in am 1 cup in PM      Objective:  Vital Signs:  BP (!) 151/93   Pulse 75   Ht 5' 11 (1.803 m)   Wt 160 lb (72.6 kg)   SpO2 98%   BMI 22.32 kg/m   General: No acute distress.   Patient appears well-groomed.   Head:  Normocephalic/atraumatic Eyes:  fundi examined, no clear papilledema Neck: supple, paraspinal tenderness, full range of motion Heart: regular rate and rhythm Lungs: Clear to auscultation bilaterally. Vascular: No carotid bruits.  Neurological Exam: Mental status: alert and oriented, speech fluent and not dysarthric, language intact.  Cranial nerves: CN I: not tested CN II: pupils equal, round and reactive to light, visual fields intact CN III, IV, VI:  full range of motion, no nystagmus, no ptosis CN V: facial sensation intact. CN VII: upper and lower face symmetric CN VIII: hearing intact CN IX, X: uvula midline CN XI: sternocleidomastoid and trapezius muscles intact CN XII: tongue midline  Bulk & Tone: normal, no fasciculations. Motor:  muscle strength 5/5 throughout Deep Tendon Reflexes:  2+ throughout.   Sensation:  Light touch sensation intact. Finger to nose testing:  Without dysmetria.   Gait:  Normal station and stride.  Labs and Imaging review: No new results  Previously reviewed results: 01/10/24: HbA1c: 5.1 Lipid panel: tChol 200, LDL 137, TG 79 TSH wnl B12: 451 Vit D: 25.75   11/11/23: Mg 2.0 Vit D: 38 MMA elevated to 365 B12: 287 B1 wnl   01/04/23: B12: 216 Vit D: 20.39   06/12/22: ESR 14 CRP < 1.0   Imaging/Procedures: CTA head and neck (10/27/19): IMPRESSION: 1. Minimal intracranial atherosclerosis without large vessel occlusion or significant proximal stenosis. 2. Widely patent cervical carotid and vertebral arteries.   MRI brain wo contrast (10/27/19): IMPRESSION: 1. No acute intracranial abnormality. 2. Absent septum pellucidum, otherwise unremarkable appearance of the brain.   EEG (10/27/19): Description: The posterior dominant rhythm consists of 10 Hz activity of moderate voltage (25-35 uV) seen predominantly in posterior head regions, symmetric and reactive to eye opening and eye closing.  Drowsiness was characterized by attenuation of the posterior background rhythm. Sleep was characterized by vertex waves, sleep spindles (12 to 14 Hz), maximal frontocentral region.  Hyperventilation did not show any EEG change.  Physiology photic driving was seen during photic stimulation.     IMPRESSION: This study is within normal limits. No seizures or epileptiform discharges were seen throughout the recording.   MRI cervical spine wo contrast (01/24/22): Findings: Slightly heterogeneous appearance bone marrow of the  clivus and minimal decreased signal intensity bone marrow of the  cervical spine may be related to the patient's habitus. Result of  anemia not entirely excluded.  No history of cancer to suggest  infiltrative process.   Visualized intracranial structures and cervical medullary junction  unremarkable.  No focal cervical cord signal abnormality.   Both vertebral arteries are patent.  Visualized paravertebral  structures unremarkable.   C2-3:  Negative.   C3-4:  Right-sided uncinate bony overgrowth with mild to slightly  moderate right foraminal narrowing.  Minimal impression upon the  right lateral ventral aspect of the thecal sac and possibly the  right ventral nerve root.   C4-5:  Minimal bulge.  Minimal left-sided uncinate bony overgrowth.   C5-6:  Mild bulge greater to the left.  Uncinate bony overgrowth  greater left.  Very mild left-sided and minimal right-sided  foraminal narrowing.  Minimal left-sided spinal stenosis.   C6-7:  Mild left uncinate hypertrophy with very mild left-sided  foraminal narrowing.   C7-T1:  Minimal facet joint degenerative changes.   IMPRESSION:  In this patient with right-sided symptoms, most notable right-sided  findings are at the C3-4 level as detailed above.    CT sinus wo contrast (08/16/23): IMPRESSION: Bilateral maxillary sinus retention cysts. Patent sinus drainage pathways. The other paranasal sinuses are clear.  MRI  brain w/wo contrast (11/24/23): IMPRESSION: 1. No acute intracranial abnormality. 2. Incidental absent septum pellucidum.  Assessment/Plan:  This is Todd Sims, a 59 y.o. male with head pressure. It is worse first thing in the morning after laying down all night. I considered OSA, but patient has not improved with CPAP. Nortriptyline  has not significantly affected head pressure either. Elevated intracranial pressure is a possibility. We discussed the risks and benefits of LP to evaluate opening pressure vs switching to topamax that would treat an etiology such as IIH. Patient would like to think about it and continue current treatment for now. He is also waiting to hear back from [facility] CSF clinic that he wanted to see.  Plan: -Continue B12 1000 mcg daily -Continue Vit D supplementation -Continue CPAP nightly for OSA -Continue nortriptyline  30 mg at bedtime -Discussed switching to Topamax vs LP. Patient would like to think about this.  Return to clinic in 6 months  Total time spent reviewing records, interview, history/exam, documentation, and coordination of care on day of encounter:  30 min  Venetia Potters, MD     [1]  Allergies Allergen Reactions   Tree Extract Cough    rhinitis  [2]  Social History Tobacco Use   Smoking status: Never   Smokeless tobacco: Never  Vaping Use   Vaping status: Never Used  Substance Use Topics   Alcohol use: Yes    Alcohol/week: 2.0 standard drinks of alcohol    Types: 2 Cans of beer per week    Comment: occ   Drug use: No   "

## 2024-04-30 ENCOUNTER — Encounter: Payer: Self-pay | Admitting: Internal Medicine

## 2024-05-01 NOTE — Telephone Encounter (Signed)
 Oh no very sorry, I dont have any sway on that.   If you are unable to see Dr Garald, remember we do have 3 Nurse Practitioners who you could certainly at least start with, as they are very good.  Hope that helps - Thanks!

## 2024-05-04 ENCOUNTER — Other Ambulatory Visit: Payer: Self-pay

## 2024-05-04 ENCOUNTER — Encounter: Payer: Self-pay | Admitting: Neurology

## 2024-05-04 ENCOUNTER — Ambulatory Visit (INDEPENDENT_AMBULATORY_CARE_PROVIDER_SITE_OTHER): Admitting: Neurology

## 2024-05-04 ENCOUNTER — Telehealth: Payer: Self-pay | Admitting: Neurology

## 2024-05-04 VITALS — BP 151/93 | HR 75 | Ht 71.0 in | Wt 160.0 lb

## 2024-05-04 DIAGNOSIS — Z8781 Personal history of (healed) traumatic fracture: Secondary | ICD-10-CM | POA: Diagnosis not present

## 2024-05-04 DIAGNOSIS — R519 Headache, unspecified: Secondary | ICD-10-CM

## 2024-05-04 DIAGNOSIS — E559 Vitamin D deficiency, unspecified: Secondary | ICD-10-CM

## 2024-05-04 DIAGNOSIS — R4189 Other symptoms and signs involving cognitive functions and awareness: Secondary | ICD-10-CM

## 2024-05-04 DIAGNOSIS — G4733 Obstructive sleep apnea (adult) (pediatric): Secondary | ICD-10-CM | POA: Diagnosis not present

## 2024-05-04 DIAGNOSIS — E538 Deficiency of other specified B group vitamins: Secondary | ICD-10-CM

## 2024-05-04 MED ORDER — NORTRIPTYLINE HCL 10 MG PO CAPS: 30.0000 mg | ORAL_CAPSULE | Freq: Every day | ORAL | 5 refills | Status: AC

## 2024-05-04 NOTE — Telephone Encounter (Signed)
 Pt   nortriptyline  (PAMELOR ) 10 MG capsule   [facility]

## 2024-05-04 NOTE — Patient Instructions (Addendum)
 We will not make any changes today at your request.  -Continue B12 1000 mcg daily -Continue Vit D supplementation -Continue CPAP nightly for OSA -Continue nortriptyline  30 mg at bedtime  We did discuss the pros and cons of doing a lumbar puncture to check your CSF pressure. We also discussed switching your nortriptyline  to topamax that can treat high pressure. You want to think about this. Let me know if you want to do either.  I will see you again in 6 months. Please let me know if you have any questions or concerns in the meantime.  The physicians and staff at [facility] are committed to providing excellent care. You may receive a survey requesting feedback about your experience at our office. We strive to receive very good responses to the survey questions. If you feel that your experience would prevent you from giving the office a very good  response, please contact our office to try to remedy the situation. We may be reached at [phone]. Thank you for taking the time out of your busy day to complete the survey.  Venetia Potters, MD [facility]

## 2024-05-04 NOTE — Telephone Encounter (Signed)
 Pt called in this morning. Pt would like all his prescriptions to go to [facility]. Thanks

## 2024-05-22 ENCOUNTER — Ambulatory Visit: Admitting: Family Medicine

## 2024-07-04 ENCOUNTER — Ambulatory Visit: Admitting: Internal Medicine

## 2024-08-25 ENCOUNTER — Encounter: Admitting: Family Medicine

## 2024-11-22 ENCOUNTER — Ambulatory Visit: Payer: Self-pay | Admitting: Neurology
